# Patient Record
Sex: Female | Born: 1975 | Race: White | Hispanic: No | Smoking: Never smoker
Health system: Southern US, Community
[De-identification: ages and names within clinical notes are randomized; demographics above are authoritative.]

## PROBLEM LIST (undated history)

## (undated) DIAGNOSIS — F32A Depression, unspecified: Secondary | ICD-10-CM

## (undated) DIAGNOSIS — F329 Major depressive disorder, single episode, unspecified: Secondary | ICD-10-CM

## (undated) DIAGNOSIS — F419 Anxiety disorder, unspecified: Secondary | ICD-10-CM

## (undated) DIAGNOSIS — E079 Disorder of thyroid, unspecified: Secondary | ICD-10-CM

## (undated) HISTORY — DX: Disorder of thyroid, unspecified: E07.9

## (undated) HISTORY — DX: Major depressive disorder, single episode, unspecified: F32.9

## (undated) HISTORY — PX: TONSILLECTOMY: SUR1361

## (undated) HISTORY — DX: Anxiety disorder, unspecified: F41.9

## (undated) HISTORY — DX: Depression, unspecified: F32.A

---

## 2000-01-15 ENCOUNTER — Encounter: Payer: Self-pay | Admitting: Obstetrics & Gynecology

## 2000-01-15 ENCOUNTER — Encounter: Admission: RE | Admit: 2000-01-15 | Discharge: 2000-01-15 | Payer: Self-pay | Admitting: Obstetrics & Gynecology

## 2000-09-18 ENCOUNTER — Emergency Department (HOSPITAL_COMMUNITY): Admission: EM | Admit: 2000-09-18 | Discharge: 2000-09-18 | Payer: Self-pay | Admitting: Emergency Medicine

## 2002-06-09 ENCOUNTER — Other Ambulatory Visit: Admission: RE | Admit: 2002-06-09 | Discharge: 2002-06-09 | Payer: Self-pay | Admitting: Obstetrics and Gynecology

## 2002-11-12 ENCOUNTER — Encounter: Admission: RE | Admit: 2002-11-12 | Discharge: 2002-11-12 | Payer: Self-pay | Admitting: Family Medicine

## 2002-11-12 ENCOUNTER — Encounter: Payer: Self-pay | Admitting: Family Medicine

## 2005-09-23 ENCOUNTER — Other Ambulatory Visit: Admission: RE | Admit: 2005-09-23 | Discharge: 2005-09-23 | Payer: Self-pay | Admitting: Obstetrics and Gynecology

## 2005-09-27 ENCOUNTER — Encounter: Admission: RE | Admit: 2005-09-27 | Discharge: 2005-09-27 | Payer: Self-pay | Admitting: Obstetrics and Gynecology

## 2006-02-05 ENCOUNTER — Encounter: Admission: RE | Admit: 2006-02-05 | Discharge: 2006-05-06 | Payer: Self-pay | Admitting: Nurse Practitioner

## 2006-03-31 ENCOUNTER — Encounter: Admission: RE | Admit: 2006-03-31 | Discharge: 2006-03-31 | Payer: Self-pay | Admitting: Obstetrics and Gynecology

## 2006-09-16 ENCOUNTER — Encounter: Admission: RE | Admit: 2006-09-16 | Discharge: 2006-09-16 | Payer: Self-pay | Admitting: Obstetrics and Gynecology

## 2007-01-14 ENCOUNTER — Encounter: Admission: RE | Admit: 2007-01-14 | Discharge: 2007-01-14 | Payer: Self-pay | Admitting: Obstetrics and Gynecology

## 2009-05-01 HISTORY — PX: BACK SURGERY: SHX140

## 2011-06-13 ENCOUNTER — Ambulatory Visit (INDEPENDENT_AMBULATORY_CARE_PROVIDER_SITE_OTHER): Payer: BC Managed Care – PPO | Admitting: Internal Medicine

## 2011-06-13 VITALS — BP 103/72 | HR 85 | Temp 98.2°F | Resp 16 | Ht 66.0 in | Wt 144.0 lb

## 2011-06-13 DIAGNOSIS — E079 Disorder of thyroid, unspecified: Secondary | ICD-10-CM | POA: Insufficient documentation

## 2011-06-13 DIAGNOSIS — R5383 Other fatigue: Secondary | ICD-10-CM

## 2011-06-13 DIAGNOSIS — E039 Hypothyroidism, unspecified: Secondary | ICD-10-CM

## 2011-06-13 DIAGNOSIS — F39 Unspecified mood [affective] disorder: Secondary | ICD-10-CM

## 2011-06-13 LAB — COMPREHENSIVE METABOLIC PANEL
ALT: 17 U/L (ref 0–35)
Albumin: 4.6 g/dL (ref 3.5–5.2)
Alkaline Phosphatase: 52 U/L (ref 39–117)
Calcium: 9.2 mg/dL (ref 8.4–10.5)
Chloride: 104 mEq/L (ref 96–112)
Creat: 0.77 mg/dL (ref 0.50–1.10)
Glucose, Bld: 93 mg/dL (ref 70–99)
Potassium: 3.9 mEq/L (ref 3.5–5.3)
Total Bilirubin: 0.4 mg/dL (ref 0.3–1.2)
Total Protein: 7.3 g/dL (ref 6.0–8.3)

## 2011-06-13 LAB — POCT CBC
Granulocyte percent: 38.6 %G (ref 37–80)
HCT, POC: 38.1 % (ref 37.7–47.9)
Hemoglobin: 12.6 g/dL (ref 12.2–16.2)
MCH, POC: 29.1 pg (ref 27–31.2)
MPV: 9.9 fL (ref 0–99.8)
POC MID %: 21.1 %M — AB (ref 0–12)
Platelet Count, POC: 182 10*3/uL (ref 142–424)
RBC: 4.33 M/uL (ref 4.04–5.48)
WBC: 2.7 10*3/uL — AB (ref 4.6–10.2)

## 2011-06-13 NOTE — Progress Notes (Signed)
  Subjective:    Patient ID: Judith Gonzalez, female    DOB: Apr 04, 1975, 36 y.o.   MRN: [id number]  HPI Has fatigue, dry skin, chills and feels low thyroid Has hx of mood disorder is on prozac   Review of Systems     Objective:   Physical Exam Entirely normal including brisk reflexes. CBC and TSH  Results for orders placed in visit on 06/13/11  POCT CBC      Component Value Range   WBC 2.7 (*) 4.6 - 10.2 (K/uL)   Lymph, poc 1.1  0.6 - 3.4    POC LYMPH PERCENT 40.3  10 - 50 (%L)   MID (cbc) 0.6  0 - 0.9    POC MID % 21.1 (*) 0 - 12 (%M)   POC Granulocyte 1.0 (*) 2 - 6.9    Granulocyte percent 38.6  37 - 80 (%G)   RBC 4.33  4.04 - 5.48 (M/uL)   Hemoglobin 12.6  12.2 - 16.2 (g/dL)   HCT, POC 56.2  13.0 - 47.9 (%)   MCV 87.9  80 - 97 (fL)   MCH, POC 29.1  27 - 31.2 (pg)   MCHC 33.1  31.8 - 35.4 (g/dL)   RDW, POC 86.5     Platelet Count, POC 182  142 - 424 (K/uL)   MPV 9.9  0 - 99.8 (fL)        Assessment & Plan:  Fatigue with a past hx of low thyroid  Lymphocytosis consider viral syndrome add cmet, ebv panel, hiv screen

## 2011-06-14 LAB — EPSTEIN-BARR VIRUS VCA ANTIBODY PANEL
EBV EA IgG: 0.34 {ISR}
EBV NA IgG: 0.7 {ISR}

## 2011-06-14 LAB — TSH: TSH: 2.838 u[IU]/mL (ref 0.350–4.500)

## 2011-06-23 ENCOUNTER — Ambulatory Visit (INDEPENDENT_AMBULATORY_CARE_PROVIDER_SITE_OTHER): Payer: BC Managed Care – PPO | Admitting: Emergency Medicine

## 2011-06-23 VITALS — BP 100/64 | HR 93 | Temp 98.7°F | Resp 16 | Ht 65.0 in | Wt 145.0 lb

## 2011-06-23 DIAGNOSIS — D72829 Elevated white blood cell count, unspecified: Secondary | ICD-10-CM

## 2011-06-23 DIAGNOSIS — Z20828 Contact with and (suspected) exposure to other viral communicable diseases: Secondary | ICD-10-CM

## 2011-06-23 DIAGNOSIS — R5383 Other fatigue: Secondary | ICD-10-CM

## 2011-06-23 DIAGNOSIS — B2790 Infectious mononucleosis, unspecified without complication: Secondary | ICD-10-CM

## 2011-06-23 DIAGNOSIS — D7289 Other specified disorders of white blood cells: Secondary | ICD-10-CM

## 2011-06-23 DIAGNOSIS — R5381 Other malaise: Secondary | ICD-10-CM

## 2011-06-23 LAB — COMPREHENSIVE METABOLIC PANEL
BUN: 9 mg/dL (ref 6–23)
CO2: 30 mEq/L (ref 19–32)
Calcium: 9.6 mg/dL (ref 8.4–10.5)
Creat: 0.64 mg/dL (ref 0.50–1.10)
Glucose, Bld: 106 mg/dL — ABNORMAL HIGH (ref 70–99)
Potassium: 4.2 mEq/L (ref 3.5–5.3)
Total Bilirubin: 0.3 mg/dL (ref 0.3–1.2)

## 2011-06-23 LAB — POCT CBC
Granulocyte percent: 72.5 %G (ref 37–80)
Hemoglobin: 11.1 g/dL — AB (ref 12.2–16.2)
Lymph, poc: 1.7 (ref 0.6–3.4)
MID (cbc): 0.7 (ref 0–0.9)
WBC: 8.6 10*3/uL (ref 4.6–10.2)

## 2011-06-23 NOTE — Progress Notes (Signed)
  Subjective:    Patient ID: Judith Gonzalez, female    DOB: 1975-09-05, 36 y.o.   MRN: [id number]  HPI patient here to followup recent diagnosis of mononucleosis. She has felt somewhat better but still feels incredibly fatigued. Apparently also in her class have had an outbreak of parvovirus and she has questions about whether her illness could be related to parvovirus.    Review of Systems patient primarily has fatigue she denies any chest pain shortness of breath abdominal pain or other symptoms     Objective:   Physical Exam  Constitutional: She appears well-developed and well-nourished.  HENT:  Head: Normocephalic.  Right Ear: External ear normal.  Left Ear: External ear normal.  Eyes: Pupils are equal, round, and reactive to light.  Neck: No JVD present. No tracheal deviation present. No thyromegaly present.  Cardiovascular: Normal rate and regular rhythm.   Pulmonary/Chest: No respiratory distress. She has no wheezes. She has no rales. She exhibits no tenderness.  Abdominal:       On abdominal exam the spleen is not palpable  Lymphadenopathy:    She has no cervical adenopathy.  Skin: She is not diaphoretic.          Assessment & Plan:   she did drop her hemoglobin from her last visit however her white count has returned to normal which is very encouraging. She continues has shoddy adenopathy in her neck. We'll recheck titers in that only one of her titers was positive before and she had normal LFTs. I'm not sure this has actually been mono but may have been some other acute viral illness.

## 2011-06-24 LAB — EPSTEIN-BARR VIRUS VCA ANTIBODY PANEL
EBV VCA IgG: 2.83 {ISR} — ABNORMAL HIGH
EBV VCA IgM: 0.64 {ISR}

## 2011-06-26 LAB — PARVOVIRUS B19 ANTIBODY, IGG AND IGM
Parovirus B19 IgG Abs: 2.6 index — ABNORMAL HIGH (ref <0.9)
Parovirus B19 IgM Abs: 6.2 index — ABNORMAL HIGH (ref <0.9)

## 2011-07-04 ENCOUNTER — Ambulatory Visit (INDEPENDENT_AMBULATORY_CARE_PROVIDER_SITE_OTHER): Payer: BC Managed Care – PPO | Admitting: Family Medicine

## 2011-07-04 ENCOUNTER — Encounter: Payer: Self-pay | Admitting: Family Medicine

## 2011-07-04 VITALS — BP 108/63 | HR 84 | Temp 97.8°F | Resp 16 | Ht 64.5 in | Wt 141.8 lb

## 2011-07-04 DIAGNOSIS — B343 Parvovirus infection, unspecified: Secondary | ICD-10-CM

## 2011-07-04 DIAGNOSIS — Z23 Encounter for immunization: Secondary | ICD-10-CM

## 2011-07-04 LAB — CBC WITH DIFFERENTIAL/PLATELET
Basophils Absolute: 0.1 10*3/uL (ref 0.0–0.1)
Basophils Relative: 1 % (ref 0–1)
Hemoglobin: 12.1 g/dL (ref 12.0–15.0)
Lymphocytes Relative: 37 % (ref 12–46)
Lymphs Abs: 2.5 10*3/uL (ref 0.7–4.0)
MCH: 29.1 pg (ref 26.0–34.0)
MCV: 90.6 fL (ref 78.0–100.0)
Monocytes Absolute: 0.9 10*3/uL (ref 0.1–1.0)
Monocytes Relative: 13 % — ABNORMAL HIGH (ref 3–12)
Neutro Abs: 2.9 10*3/uL (ref 1.7–7.7)
Platelets: 393 10*3/uL (ref 150–400)
RBC: 4.16 MIL/uL (ref 3.87–5.11)

## 2011-07-04 NOTE — Patient Instructions (Addendum)
Parvovirus B19 Antibody This is a blood test which includes several different classes of viruses. The B19 virus causes disease in humans. It is a common cause of erythema infectiosum which occurs mostly in children and is otherwise known as fifth disease.  PREPARATION FOR TEST No preparation or fasting is necessary. NORMAL FINDINGS Negative for IgM- and IgG-specific antibodies to parvovirus B19. Ranges for normal findings may vary among different laboratories and hospitals. You should always check with your doctor after having lab work or other tests done to discuss the meaning of your test results and whether your values are considered within normal limits. MEANING OF TEST  Your caregiver will go over the test results with you and discuss the importance and meaning of your results, as well as treatment options and the need for additional tests if necessary. OBTAINING THE TEST RESULTS  It is your responsibility to obtain your test results. Ask the lab or department performing the test when and how you will get your results. Document Released: 04/06/2004 Document Revised: 02/21/2011 Document Reviewed: 02/14/2008 [facility] Patient Information 2012 [place], [place].  Fifth Disease Erythema Infectiosum is called fifth disease. It is a mild illness caused by a virus. This virus most commonly occurs in children. The disease usually causes a bright red rash that appears on both cheeks. The rash has a "slapped cheek" appearance. Before the rash, the patient usually has a low-grade fever, mild upper respiratory symptoms, and a headache. One to three days after the cheek rash appears, a pink, lacy rash appears on the body, arms, and legs. This rash may come and go for up to 5 weeks. It often gets brighter following warm baths, exercise, and sun exposure. Your child may have no other symptoms or only a slight runny nose, sore throat, and very low fever. Complications are rare. This illness is quite harmless. Fifth  disease also occurs in adolescents and adults. In this age group initial symptoms will be joint pain. The joint pain is usually in the hands, wrists, and ankles. HOME CARE INSTRUCTIONS   Treatment is not necessary. No vaccine is available.   This disease is not very contagious. It is usually not necessary to keep your child away from other children.   Pregnant women should avoid being exposed.   Only take over-the-counter or prescription medicines for pain, discomfort, or fever as directed by your caregiver.  SEEK IMMEDIATE MEDICAL CARE IF:   An oral temperature above 102 F (38.9 C) develops, or the temperature remains high and is not controlled by medication.   Your child seems to be getting worse.   The rash becomes itchy.  MAKE SURE YOU:   Understand these instructions.   Will watch your condition.   Will get help right away if you are not doing well or get worse.  Document Released: 03/01/2000 Document Revised: 02/21/2011 Document Reviewed: 07/01/2010 [facility] Patient Information 2012 [place], [place].

## 2011-07-04 NOTE — Progress Notes (Signed)
  Subjective:    Patient ID: Judith Gonzalez, female    DOB: 05-11-1975, 36 y.o.   MRN: [id number]  HPI  This 36 y.o. 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 She is here for repeat labs (ESR and CBC) as she  had abnormal results during her acute illness. She had mild arthralgias last week which were relieved  with OTC NSAIDs. Denies fever, headache, rash or fatigue.     Pt requests Tetanus; she was bitten on the finger by a small squirrel a few days ago. She [facility]  [facility] to find out what to do and was told that squirrels rarely carry Rabies; this animal did not  appear rabid  to the pt. The wound did not appear to be infected per pt.    Review of Systems As per HPI     Objective:   Physical Exam  Nursing note and vitals reviewed. Constitutional: She is oriented to person, place, and time. She appears well-developed and well-nourished. No distress.  HENT:  Head: Normocephalic and atraumatic.  Right Ear: External ear normal.  Left Ear: External ear normal.  Mouth/Throat: Oropharynx is clear and moist. No oropharyngeal exudate.  Eyes: EOM are normal. Right eye exhibits no discharge. Left eye exhibits no discharge. No scleral icterus.       Conj injected  Neck: Normal range of motion. Neck supple. No thyromegaly present.  Cardiovascular: Normal rate, regular rhythm and normal heart sounds.   No murmur heard. Pulmonary/Chest: Effort normal and breath sounds normal. No respiratory distress.  Abdominal: She exhibits no mass. There is no tenderness. There is no guarding.  Musculoskeletal: Normal range of motion. She exhibits no edema.  Lymphadenopathy:    She has no cervical adenopathy.  Neurological: She is alert and oriented to person, place, and time. No cranial nerve deficit.  Skin: Skin is warm and dry. No erythema.  Psychiatric: She has a normal mood and affect. Her behavior is normal.  Thought content normal.          Assessment & Plan:   1. Parvovirus infection  CBC with Differential, Sedimentation Rate  2. Need for prophylactic vaccination with combined diphtheria-tetanus-pertussis (DTP) vaccine  Tdap vaccine greater than or equal to 7yo IM

## 2011-07-05 LAB — SEDIMENTATION RATE: Sed Rate: 11 mm/hr (ref 0–22)

## 2011-07-05 NOTE — Progress Notes (Signed)
Quick Note:  Please notify pt that results are normal.   Provide pt with copy of labs. ______ 

## 2011-10-18 ENCOUNTER — Other Ambulatory Visit: Payer: Self-pay | Admitting: *Deleted

## 2011-10-18 MED ORDER — FLUOXETINE HCL 20 MG PO CAPS: 20.0000 mg | ORAL_CAPSULE | Freq: Every day | ORAL | Status: DC

## 2011-11-18 ENCOUNTER — Other Ambulatory Visit: Payer: Self-pay

## 2011-11-18 MED ORDER — FLUOXETINE HCL 20 MG PO CAPS: 20.0000 mg | ORAL_CAPSULE | Freq: Every day | ORAL | Status: DC

## 2011-12-13 ENCOUNTER — Ambulatory Visit (INDEPENDENT_AMBULATORY_CARE_PROVIDER_SITE_OTHER): Payer: BC Managed Care – PPO | Admitting: Family Medicine

## 2011-12-13 ENCOUNTER — Encounter: Payer: Self-pay | Admitting: Family Medicine

## 2011-12-13 VITALS — BP 102/68 | HR 62 | Temp 97.7°F | Resp 16 | Ht 64.5 in | Wt 141.6 lb

## 2011-12-13 DIAGNOSIS — F3289 Other specified depressive episodes: Secondary | ICD-10-CM

## 2011-12-13 DIAGNOSIS — F32A Depression, unspecified: Secondary | ICD-10-CM

## 2011-12-13 DIAGNOSIS — F329 Major depressive disorder, single episode, unspecified: Secondary | ICD-10-CM

## 2011-12-13 MED ORDER — FLUOXETINE HCL 40 MG PO CAPS: 40.0000 mg | ORAL_CAPSULE | Freq: Every day | ORAL | Status: DC

## 2011-12-13 NOTE — Patient Instructions (Signed)
Fluoxetine dose has been increased to 40 mg every AM; if you start to feel more nervous, insomnia, decreased appetite or nausea, decreased sex drive, then please call the office. I may have to reduce your dose to 30 mg; the goal is to improve your mood and mental functioning without causing side effects.

## 2011-12-17 ENCOUNTER — Encounter: Payer: Self-pay | Admitting: Family Medicine

## 2011-12-17 NOTE — Progress Notes (Signed)
S: This 36 y.o. Cauc female  Is here for medication refill (Fluoxetine). She reports increased symptoms c/w worsening depression. These are mild changes  (sleep disturbance, concentration difficulties and feeling "down") but she wants to discuss increasing current medication versus trying a different medication. She would eventually like to discontinue this medication.  ROS: Negative for appetite change or unexpected weight change, diaphoresis, palpitations or CP, SOB, nausea or vomiting, change in stools or abd pain, HA or dizziness, numbness or weakness, agitation, dysphoric mood or crying spells, significant loss of sleep, behavior problems or SI/HI.   O:  Filed Vitals:   12/13/11 1446  BP: 102/68                                                Weight is down 2 lbs in 6 months  Pulse: 62  Temp: 97.7 F (36.5 C)  Resp: 16   GEN: In NAD; WN,WD. HENT: [place]/AT; EOMI with clear conj/scl. COR: RRR LUNGS: Normal resp rate and effort. NEURO: A&O x 3; CNs intact, otherwise unremarkable.  A/P: 1. Depression   Increase Fluoxetine to 40 mg every AM; advised of adverse effects. May have to adjust dose downward to 30 mg daily if side effects occur.

## 2012-03-13 ENCOUNTER — Encounter: Payer: Self-pay | Admitting: Family Medicine

## 2012-03-13 ENCOUNTER — Ambulatory Visit (INDEPENDENT_AMBULATORY_CARE_PROVIDER_SITE_OTHER): Payer: BC Managed Care – PPO | Admitting: Family Medicine

## 2012-03-13 VITALS — BP 112/74 | HR 70 | Temp 99.0°F | Resp 16 | Ht 66.0 in | Wt 144.0 lb

## 2012-03-13 DIAGNOSIS — F329 Major depressive disorder, single episode, unspecified: Secondary | ICD-10-CM

## 2012-03-13 DIAGNOSIS — Z760 Encounter for issue of repeat prescription: Secondary | ICD-10-CM

## 2012-03-13 DIAGNOSIS — F32A Depression, unspecified: Secondary | ICD-10-CM

## 2012-03-13 DIAGNOSIS — R1013 Epigastric pain: Secondary | ICD-10-CM

## 2012-03-13 DIAGNOSIS — K3189 Other diseases of stomach and duodenum: Secondary | ICD-10-CM

## 2012-03-13 DIAGNOSIS — F3289 Other specified depressive episodes: Secondary | ICD-10-CM

## 2012-03-13 MED ORDER — FLUOXETINE HCL 40 MG PO CAPS: 40.0000 mg | ORAL_CAPSULE | Freq: Every day | ORAL | Status: DC

## 2012-03-13 NOTE — Patient Instructions (Addendum)
Indigestion  Indigestion is discomfort in the upper abdomen that is caused by underlying problems such as gastroesophageal reflux disease (GERD), ulcers, or gallbladder problems.   CAUSES   Indigestion can be caused by many things. Possible causes include:   Stomach acid in the esophagus.   Stomach infections, usually caused by the bacteria H. pylori.   Being overweight.   Hiatal hernia. This means part of the stomach pushes up through the diaphragm.   Overeating.   Emotional problems, such as stress, anxiety, or depression.   Poor nutrition.   Consuming too much alcohol, tobacco, or caffeine.   Consuming spicy foods, fats, peppermint, chocolate, tomato products, citrus, or fruit juices.   Medicines such as aspirin and other anti-inflammatory drugs, hormones, steroids, and thyroid medicines.   Gastroparesis. This is a condition in which the stomach does not empty properly.   Stomach cancer.   Pregnancy, due to an increase in hormone levels, a relaxation of muscles in the digestive tract, and pressure on the stomach from the growing fetus.  SYMPTOMS    Uncomfortable feeling of fullness after eating.   Pain or burning sensation in the upper abdomen.   Bloating.   Belching and gas.   Nausea and vomiting.   Acidic taste in the mouth.   Burning sensation in the chest (heartburn).  DIAGNOSIS   Your caregiver will review your medical history and perform a physical exam. Other tests, such as blood tests, stool tests, X-rays, and other imaging scans, may be done to check for more serious problems.  TREATMENT   Liquid antacids and other drugs may be given to block stomach acid secretion. Medicines that increase esophageal muscle tone may also be given to help reduce symptoms. If an infection is found, antibiotic medicine may be given.  HOME CARE INSTRUCTIONS   Avoid foods and drinks that make your symptoms worse, such as:   Caffeine or alcoholic drinks.   Chocolate.   Peppermint or mint  flavorings.   Garlic and onions.   Spicy foods.   Citrus fruits, such as oranges, lemons, or limes.   Tomato-based foods such as sauce, chili, salsa, and pizza.   Fried and fatty foods.   Avoid eating for the 3 hours prior to your bedtime.   Eat small, frequent meals instead of large meals.   Stop smoking if you smoke.   Maintain a healthy weight.   Wear loose-fitting clothing. Do not wear anything tight around your waist that causes pressure on your stomach.   Raise the head of your bed 4 to 8 inches with wood blocks to help you sleep. Extra pillows will not help.   Only take over-the-counter or prescription medicines as directed by your caregiver.   Do not take aspirin, ibuprofen, or other nonsteroidal anti-inflammatory drugs (NSAIDs).  SEEK IMMEDIATE MEDICAL CARE IF:    You are not better after 2 days.   You have chest pressure or pain that radiates up into your neck, arms, back, jaw, or upper abdomen.   You have difficulty swallowing.   You keep vomiting.   You have black or bloody stools.   You have a fever.   You have dizziness, fainting, difficulty breathing, or heavy sweating.   You have severe abdominal pain.   You lose weight without trying.  MAKE SURE YOU:   Understand these instructions.   Will watch your condition.   Will get help right away if you are not doing well or get worse.  Document Released: 04/11/2004 

## 2012-03-18 DIAGNOSIS — F32A Depression, unspecified: Secondary | ICD-10-CM | POA: Insufficient documentation

## 2012-03-18 DIAGNOSIS — F329 Major depressive disorder, single episode, unspecified: Secondary | ICD-10-CM | POA: Insufficient documentation

## 2012-03-18 NOTE — Progress Notes (Signed)
S: This 37 y.o  Cauc female returns for follow-up re: increased dose of anti-depressant medication. She feels better, is able to focus at work and at home, has less fatigue and is sleeping better. She has no adverse effects with increased Fluoxetine dose of 40 mg.   Pt reports chronic dyspepsia for which she has been taking OTC Zantac; this is usually effective but she is not taking it daily. She reports no particular food intolerances, denies early satiety, n/v/d or constipation, BRBPR or melena. She has had no unexplained weight loss.  ROS: As per HPI.  O: Filed Vitals:   03/13/12 1311  BP: 112/74  Pulse: 70  Temp: 99 F (37.2 C)  Resp: 16   GEN: In NAD; WN,WD. HENT: [place]/AT; EOMMI w/ clear conj, nonicteric sclerae. Post ph is moist and clear w/o lesions. NECK: Supple w/o LAN orTMG. BACK: No CVAT. COR: RRR. LUNGS: Normal resp rate and effort. ABD: Nl appearance/ flat w/o distention. Minimal epig tenderness. Murphy's sign negative. No masses or HSM. NEURO: A&O x 3; CNs intact. Nonfocal.  A/P:  1. Depression - stable on current dose of Fluoxetine 40 mg daily.  2. Dyspepsia  Continue OTC Zantac daily; if symptoms do not resolve w/ dietary modifications, RTC for re-evaluation. Pt noted to have elevated LFTs in April at time of Parvovirus infection. She declines repeat testing at this time.  3. Issue of repeat prescriptions

## 2012-12-08 ENCOUNTER — Other Ambulatory Visit: Payer: Self-pay | Admitting: Family Medicine

## 2012-12-17 ENCOUNTER — Encounter: Payer: Self-pay | Admitting: Family Medicine

## 2012-12-17 ENCOUNTER — Ambulatory Visit (INDEPENDENT_AMBULATORY_CARE_PROVIDER_SITE_OTHER): Payer: BC Managed Care – PPO | Admitting: Family Medicine

## 2012-12-17 VITALS — BP 109/70 | HR 79 | Temp 98.1°F | Resp 16 | Ht 65.5 in | Wt 138.0 lb

## 2012-12-17 DIAGNOSIS — Z23 Encounter for immunization: Secondary | ICD-10-CM

## 2012-12-17 DIAGNOSIS — F3289 Other specified depressive episodes: Secondary | ICD-10-CM

## 2012-12-17 DIAGNOSIS — F32A Depression, unspecified: Secondary | ICD-10-CM

## 2012-12-17 DIAGNOSIS — F329 Major depressive disorder, single episode, unspecified: Secondary | ICD-10-CM

## 2012-12-17 MED ORDER — FLUOXETINE HCL 40 MG PO CAPS: 40.0000 mg | ORAL_CAPSULE | Freq: Every day | ORAL | Status: DC

## 2012-12-17 NOTE — Progress Notes (Signed)
S: This 37 y.o. Cauc female has chronic depression, well controlled on Fluoxetine. No adverse effects reported. Pt states she is doing well and has no new concerns. Pt eats and sleeps well and has no thoughts of self harm. She has no moods swings and no abnormal behavior or thoughts.  Pt recently started taking iron supplement because she was having unexplained bruising. She does note that she plays soccer w/ her son so that might explain the bruises. They have disappeared since starting iron. Review of CBC from last year does not reveal anemia or other abnormalities.   Patient Active Problem List   Diagnosis Date Noted  . Depression 03/18/2012  . Thyroid dysfunction 06/13/2011   PMHx, Soc Hx and Fam Hx reviewed.   ROS: As per HPI.  O: Filed Vitals:   12/17/12 1613  BP: 109/70  Pulse: 79  Temp: 98.1 F (36.7 C)  Resp: 16   GEN: In NAD; WN,WD. HENT: [place]/AT; EOMI w/ clear conj/sclerae. Otherwise unremarkable. COR: RRR. LUNGS: Normal resp rate and effort. SKIN: W&D; intact w/o erythema, rashes or pallor. NEURO: A&O x 3; Cns intact. Nonfocal.  A/P: Depression-  Stable on Fluoxetine; continue current dose 40 mg daily.  Need for prophylactic vaccination and inoculation against influenza - Plan: Flu Vaccine QUAD 36+ mos IM

## 2013-03-23 ENCOUNTER — Other Ambulatory Visit: Payer: Self-pay | Admitting: Family Medicine

## 2013-03-23 ENCOUNTER — Ambulatory Visit: Payer: Self-pay

## 2013-03-23 ENCOUNTER — Other Ambulatory Visit: Payer: Self-pay | Admitting: *Deleted

## 2013-03-23 DIAGNOSIS — R52 Pain, unspecified: Secondary | ICD-10-CM

## 2013-05-07 ENCOUNTER — Encounter: Payer: Self-pay | Admitting: Family Medicine

## 2013-05-07 ENCOUNTER — Ambulatory Visit (INDEPENDENT_AMBULATORY_CARE_PROVIDER_SITE_OTHER): Payer: BC Managed Care – PPO | Admitting: Family Medicine

## 2013-05-07 VITALS — BP 100/62 | HR 71 | Temp 97.7°F | Resp 16 | Ht 65.0 in | Wt 143.2 lb

## 2013-05-07 DIAGNOSIS — F3289 Other specified depressive episodes: Secondary | ICD-10-CM

## 2013-05-07 DIAGNOSIS — F32A Depression, unspecified: Secondary | ICD-10-CM

## 2013-05-07 DIAGNOSIS — E079 Disorder of thyroid, unspecified: Secondary | ICD-10-CM

## 2013-05-07 DIAGNOSIS — Z808 Family history of malignant neoplasm of other organs or systems: Secondary | ICD-10-CM

## 2013-05-07 DIAGNOSIS — Z13220 Encounter for screening for lipoid disorders: Secondary | ICD-10-CM

## 2013-05-07 DIAGNOSIS — F329 Major depressive disorder, single episode, unspecified: Secondary | ICD-10-CM

## 2013-05-07 LAB — LIPID PANEL
Cholesterol: 152 mg/dL (ref 0–200)
HDL: 49 mg/dL (ref >39)
LDL Cholesterol: 87 mg/dL (ref 0–99)
Total CHOL/HDL Ratio: 3.1 ratio
Triglycerides: 78 mg/dL (ref <150)
VLDL: 16 mg/dL (ref 0–40)

## 2013-05-07 LAB — T3, FREE: T3 FREE: 3 pg/mL (ref 2.3–4.2)

## 2013-05-07 LAB — COMPLETE METABOLIC PANEL WITHOUT GFR
ALT: 15 U/L (ref 0–35)
AST: 18 U/L (ref 0–37)
Albumin: 4.5 g/dL (ref 3.5–5.2)
Alkaline Phosphatase: 44 U/L (ref 39–117)
BUN: 12 mg/dL (ref 6–23)
CO2: 29 meq/L (ref 19–32)
Calcium: 9.3 mg/dL (ref 8.4–10.5)
Chloride: 103 meq/L (ref 96–112)
Creat: 0.67 mg/dL (ref 0.50–1.10)
GFR, Est African American: >89 mL/min
GFR, Est Non African American: >89 mL/min
Glucose, Bld: 91 mg/dL (ref 70–99)
Potassium: 4.2 meq/L (ref 3.5–5.3)
Sodium: 138 meq/L (ref 135–145)
Total Bilirubin: 0.4 mg/dL (ref 0.2–1.2)
Total Protein: 7 g/dL (ref 6.0–8.3)

## 2013-05-07 LAB — CBC WITH DIFFERENTIAL/PLATELET
BASOS ABS: 0.1 10*3/uL (ref 0.0–0.1)
Basophils Relative: 1 % (ref 0–1)
EOS ABS: 0.1 10*3/uL (ref 0.0–0.7)
EOS PCT: 2 % (ref 0–5)
HEMATOCRIT: 37.7 % (ref 36.0–46.0)
HEMOGLOBIN: 12.9 g/dL (ref 12.0–15.0)
LYMPHS ABS: 1.7 10*3/uL (ref 0.7–4.0)
LYMPHS PCT: 30 % (ref 12–46)
MCH: 30.2 pg (ref 26.0–34.0)
MCHC: 34.2 g/dL (ref 30.0–36.0)
MCV: 88.3 fL (ref 78.0–100.0)
MONO ABS: 0.6 10*3/uL (ref 0.1–1.0)
MONOS PCT: 11 % (ref 3–12)
NEUTROS ABS: 3.1 10*3/uL (ref 1.7–7.7)
Neutrophils Relative %: 56 % (ref 43–77)
PLATELETS: 269 10*3/uL (ref 150–400)
RBC: 4.27 MIL/uL (ref 3.87–5.11)
RDW: 13.1 % (ref 11.5–15.5)
WBC: 5.6 10*3/uL (ref 4.0–10.5)

## 2013-05-07 LAB — TSH: TSH: 2.72 u[IU]/mL (ref 0.350–4.500)

## 2013-05-07 LAB — T4, FREE: Free T4: 0.65 ng/dL — ABNORMAL LOW (ref 0.80–1.80)

## 2013-05-07 MED ORDER — FLUOXETINE HCL 40 MG PO CAPS: 40.0000 mg | ORAL_CAPSULE | Freq: Every day | ORAL | Status: DC

## 2013-05-07 NOTE — Patient Instructions (Signed)
Depression, Adult Depression refers to feeling sad, low, down in the dumps, blue, gloomy, or empty. In general, there are two kinds of depression: 1. Depression that we all experience from time to time because of upsetting life experiences, including the loss of a job or the ending of a relationship (normal sadness or normal grief). This kind of depression is considered normal, is short lived, and resolves within a few days to 2 weeks. (Depression experienced after the loss of a loved one is called bereavement. Bereavement often lasts longer than 2 weeks but normally gets better with time.) 2. Clinical depression, which lasts longer than normal sadness or normal grief or interferes with your ability to function at home, at work, and in school. It also interferes with your personal relationships. It affects almost every aspect of your life. Clinical depression is an illness. Symptoms of depression also can be caused by conditions other than normal sadness and grief or clinical depression. Examples of these conditions are listed as follows:  Physical illness Some physical illnesses, including underactive thyroid gland (hypothyroidism), severe anemia, specific types of cancer, diabetes, uncontrolled seizures, heart and lung problems, strokes, and chronic pain are commonly associated with symptoms of depression.  Side effects of some prescription medicine In some people, certain types of prescription medicine can cause symptoms of depression.  Substance abuse Abuse of alcohol and illicit drugs can cause symptoms of depression. SYMPTOMS Symptoms of normal sadness and normal grief include the following:  Feeling sad or crying for short periods of time.  Not caring about anything (apathy).  Difficulty sleeping or sleeping too much.  No longer able to enjoy the things you used to enjoy.  Desire to be by oneself all the time (social isolation).  Lack of energy or motivation.  Difficulty  concentrating or remembering.  Change in appetite or weight.  Restlessness or agitation. Symptoms of clinical depression include the same symptoms of normal sadness or normal grief and also the following symptoms:  Feeling sad or crying all the time.  Feelings of guilt or worthlessness.  Feelings of hopelessness or helplessness.  Thoughts of suicide or the desire to harm yourself (suicidal ideation).  Loss of touch with reality (psychotic symptoms). Seeing or hearing things that are not real (hallucinations) or having false beliefs about your life or the people around you (delusions and paranoia). DIAGNOSIS  The diagnosis of clinical depression usually is based on the severity and duration of the symptoms. Your caregiver also will ask you questions about your medical history and substance use to find out if physical illness, use of prescription medicine, or substance abuse is causing your depression. Your caregiver also may order blood tests. TREATMENT  Typically, normal sadness and normal grief do not require treatment. However, sometimes antidepressant medicine is prescribed for bereavement to ease the depressive symptoms until they resolve. The treatment for clinical depression depends on the severity of your symptoms but typically includes antidepressant medicine, counseling with a mental health professional, or a combination of both. Your caregiver will help to determine what treatment is best for you. Depression caused by physical illness usually goes away with appropriate medical treatment of the illness. If prescription medicine is causing depression, talk with your caregiver about stopping the medicine, decreasing the dose, or substituting another medicine. Depression caused by abuse of alcohol or illicit drugs abuse goes away with abstinence from these substances. Some adults need professional help in order to stop drinking or using drugs. SEEK IMMEDIATE CARE IF:  You have   thoughts  about hurting yourself or others.  You lose touch with reality (have psychotic symptoms).  You are taking medicine for depression and have a serious side effect. FOR MORE INFORMATION National Alliance on Mental Illness: www.nami.[employer]: https://carter.com/ Document Released: 03/01/2000 Document Revised: 09/03/2011 Document Reviewed: 06/03/2011 [facility] Patient Information 2014 [place].    Continue your current medications. Also, you may want to start a [place] which is full of omega-3 oils ; in general, this helps give you a sense of well being and it has anti-inflammatory properties.  Emmit Alexanders with everything and I will see you in 2 months. You can contact me through MyChart once you get it set up.

## 2013-05-08 LAB — VITAMIN D 25 HYDROXY (VIT D DEFICIENCY, FRACTURES): Vit D, 25-Hydroxy: 45 ng/mL (ref 30–89)

## 2013-05-12 ENCOUNTER — Encounter: Payer: Self-pay | Admitting: Family Medicine

## 2013-05-12 DIAGNOSIS — Z808 Family history of malignant neoplasm of other organs or systems: Secondary | ICD-10-CM | POA: Insufficient documentation

## 2013-05-12 NOTE — Progress Notes (Signed)
S:  This 38 y.o. Cauc female has hx of chronic depression with periods where Fluoxetine seems to be ineffective. Current stressors in her family life contribute to that feeling for several weeks. She and her husband are separated; she has a son w/ learning disabilities. 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 She has found that there is less stress in the home w/ this change. Schedule now allows for better self-care with improved nutrition and regular exercise. Pt requests thyroid function test due to hx of mild dysfunction; fatigue has lessened w/ lifestyle changes. She denies skin changes, constipation or other GI changes. She does endorse mild sleep disturbance associated w/ depression and recent stressors.  Pt requests DERM referral due to family hx of melanoma; her brother has had several lesions removed  (he is in his 71s).  Patient Active Problem List   Diagnosis Date Noted  . Depression 03/18/2012  . Thyroid dysfunction 06/13/2011   Prior to Admission medications   Medication Sig Start Date End Date Taking? Authorizing Provider  cetirizine (ZYRTEC) 10 MG tablet Take 10 mg by mouth daily.   Yes Historical Provider, MD  cholecalciferol (VITAMIN D) 1000 UNITS tablet Take 1,000 Units by mouth daily.   Yes Historical Provider, MD  ferrous fumarate (HEMOCYTE - 106 MG FE) 325 (106 FE) MG TABS tablet Take 1 tablet by mouth.   Yes Historical Provider, MD  FLUoxetine (PROZAC) 40 MG capsule Take 1 capsule (40 mg total) by mouth daily. 05/07/13  Yes Barton Fanny, MD  Multiple Vitamin (MULTIVITAMIN) tablet Take 1 tablet by mouth daily.   Yes Historical Provider, MD   PMHx, Surg Hx, Soc and Fam Hx reviewed.    ROS: As per HPI. Negative for anorexia, abnormal weight change, vision disturbances, CP or tightness, palpitations, SOB or DOE, cough, GI problems, skin or hair changes, HA, dizziness, asymmetric numbness or weakness or syncope.  O: Filed  Vitals:   05/07/13 0928  BP: 100/62  Pulse: 71  Temp: 97.7 F (36.5 C)  Resp: 16   GEN: in NAD: WN,WD. HENT: [place]/AT. EOMI w/ clear conj/sclerae; otherwise unremarkable. COR: RRR. No edema. LUNGS: Normal resp rate and effort. SKIN: W&D; intact w/o diaphoresis, erythema or pallor. MS: MAEs; no joint deformities or muscle atrophy. NEURO: A&O x 3; CNs intact. Nonfocal.  A/P: Depression - Pt agrees to continue current dose of Fluoxetine along w/ lifestyle changes.  She thinks that she will feel better once her personal issues have been sorted out and the winter weather improves as spring approaches. Plan: CBC with Differential, Vitamin D, 25-hydroxy  Thyroid dysfunction - Plan: TSH, T3, Free, T4, Free, COMPLETE METABOLIC PANEL WITH GFR  Family history of melanoma - Plan: Ambulatory referral to Dermatology  Screening for hyperlipidemia - Plan: Lipid panel  Meds ordered this encounter  Medications  . FLUoxetine (PROZAC) 40 MG capsule    Sig: Take 1 capsule (40 mg total) by mouth daily.    Dispense:  90 capsule    Refill:  1

## 2013-05-14 ENCOUNTER — Other Ambulatory Visit: Payer: Self-pay | Admitting: Family Medicine

## 2013-05-14 MED ORDER — FLUOXETINE HCL 20 MG PO CAPS: ORAL_CAPSULE | ORAL | Status: DC

## 2013-06-15 ENCOUNTER — Other Ambulatory Visit: Payer: Self-pay | Admitting: Obstetrics

## 2013-06-15 DIAGNOSIS — N63 Unspecified lump in unspecified breast: Secondary | ICD-10-CM

## 2013-06-16 ENCOUNTER — Ambulatory Visit
Admission: RE | Admit: 2013-06-16 | Discharge: 2013-06-16 | Disposition: A | Discharge status: Home / Self Care | Payer: BC Managed Care – PPO | Source: Ambulatory Visit | Attending: Obstetrics | Admitting: Obstetrics

## 2013-06-16 DIAGNOSIS — N63 Unspecified lump in unspecified breast: Secondary | ICD-10-CM

## 2013-06-18 ENCOUNTER — Encounter: Payer: Self-pay | Admitting: Family Medicine

## 2013-07-09 ENCOUNTER — Ambulatory Visit (INDEPENDENT_AMBULATORY_CARE_PROVIDER_SITE_OTHER): Payer: BC Managed Care – PPO | Admitting: Family Medicine

## 2013-07-09 ENCOUNTER — Encounter: Payer: Self-pay | Admitting: Family Medicine

## 2013-07-09 VITALS — BP 86/56 | HR 72 | Temp 98.7°F | Resp 16 | Ht 65.0 in | Wt 143.4 lb

## 2013-07-09 DIAGNOSIS — F32A Depression, unspecified: Secondary | ICD-10-CM

## 2013-07-09 DIAGNOSIS — F329 Major depressive disorder, single episode, unspecified: Secondary | ICD-10-CM

## 2013-07-09 DIAGNOSIS — F3289 Other specified depressive episodes: Secondary | ICD-10-CM

## 2013-07-09 MED ORDER — BUPROPION HCL ER (XL) 150 MG PO TB24: 150.0000 mg | ORAL_TABLET | Freq: Every day | ORAL | Status: DC

## 2013-07-09 NOTE — Patient Instructions (Addendum)
Wellbutrin XL 150 mg has been prescribed for depression (generic is Bupropion). Some of the side effects include agitation and insomnia; this is why it is important to take it early in the day. You may have some weight change, headache, GI side effects (minor), palpitations and elevated blood pressure.  Contact me through MyChart about how this medication is working for you.

## 2013-07-11 ENCOUNTER — Encounter: Payer: Self-pay | Admitting: Family Medicine

## 2013-07-11 NOTE — Progress Notes (Signed)
S:  This 38 y.o. Cauc female has chronic depression, previously treated w/ Fluoxetine. Dose had been as high as 60 mg but she was having trouble w/ focus and sleep disturbance. Her counselor recommended Wellbutrin as several clients had good results w/ increased focus w/o significant side effects. Pt reduced Fluoxetine to 40 mg then 20 mg daily as instructed. She has 1 capsule left. Personal stressors are being managed; she is separated from her husband and her 15 y.o.son is a "special needs" child. She is not employed at this time by choice.  Patient Active Problem List   Diagnosis Date Noted  . Family history of melanoma 05/12/2013  . Depression 03/18/2012  . Thyroid dysfunction 06/13/2011   PMHx, Surg Hx, Soc and Fam Hx reviewed.  MEDICATIoNS reconciled.  ROS: As per HPI. Negative for anorexia, diaphoresis, CP or tightness, palpitations, HA, dizziness, weakness, syncope, agitation, confusion or behavior disturbances.. No thoughts of self harm, SI/HI.  O: Filed Vitals:   07/09/13 1105  BP: 86/56  Pulse: 72  Temp: 98.7 F (37.1 C)  Resp: 16   GEN: In NAD; WN,WD. Weight is stable. HENT: [place]/AT; EOMI w/ clear conj/sclerae. Otherwise unremarkble. COR: RRR. LUNGS: Normal resp rate and effort. SKIN: W&D; intact w/o diaphoresis, erythema or pallor. NEURO: A&O x 3; CNs intact. Nonfocal. PSYCH: Pleasant mood and calm demeanor. Conversant, attentive w/ good eye contact. Speech pattern and thought content appropriate. Judgement sound. Depression screen score= 9.  A/P: Depression- Discontinue Fluoxetine. Start Wellbutrin XL 150 mg every morning. Contact me via MyChart to let me know how you are doing w/ this medication. Dose can be increased if necessary.  Meds ordered this encounter  Medications         . buPROPion (WELLBUTRIN XL) 150 MG 24 hr tablet    Sig: Take 1 tablet (150 mg total) by mouth daily.    Dispense:  30 tablet    Refill:  5

## 2013-08-20 ENCOUNTER — Ambulatory Visit (INDEPENDENT_AMBULATORY_CARE_PROVIDER_SITE_OTHER): Payer: BC Managed Care – PPO | Admitting: Family Medicine

## 2013-08-20 ENCOUNTER — Encounter: Payer: Self-pay | Admitting: Family Medicine

## 2013-08-20 VITALS — BP 100/60 | HR 64 | Temp 98.5°F | Resp 16 | Ht 65.0 in | Wt 140.2 lb

## 2013-08-20 DIAGNOSIS — F3289 Other specified depressive episodes: Secondary | ICD-10-CM

## 2013-08-20 DIAGNOSIS — F329 Major depressive disorder, single episode, unspecified: Secondary | ICD-10-CM

## 2013-08-20 DIAGNOSIS — G5600 Carpal tunnel syndrome, unspecified upper limb: Secondary | ICD-10-CM

## 2013-08-20 DIAGNOSIS — F32A Depression, unspecified: Secondary | ICD-10-CM

## 2013-08-20 MED ORDER — METHYLPREDNISOLONE (PAK) 4 MG PO TABS: ORAL_TABLET | ORAL | Status: DC

## 2013-08-20 NOTE — Patient Instructions (Addendum)
You can take Wellbutrin XL 150 mg  2 tablets once a day to see if the increase dose helps. Maximum dose is actually 450 mg daily.  I have ordered a referral to Dr. Fredna Dow for Carpel Tunnel Syndrome; you can take Aleve 2 tablets twice daily with food. Wear the wrist protector as much as you can.   Take the prednisone dose pack as per package directions.

## 2013-08-20 NOTE — Progress Notes (Signed)
S:  This 38 y.o. Cauc female has depression and sees a therapist weekly. Wellbutrin XL 150 mg 1 tablet daily was started in April but pt finds this dose ineffective. Her therapist recommends she increase dose to 300 mg daily; she is willing to try that before quitting the medication. Issues at home are essentially unchanged.  Pt c/o bilateral wrist pain, intermittent for 2-3 years. Now she has persistent bilateral wrist and hand pain (R>>L). She is R-hand dominant. Wearing a soft R wrist splint helps some but she is awaken at 4 AM with severe R wrist/hand pain. She works on a Teaching laboratory technician. Writ and hand pain increases w/ driving and other activities where her wrists are flexed. She denies weakness or dropping items.  Pain and numbness extend into to forearms. She has not taken any OTC medications.  Patient Active Problem List   Diagnosis Date Noted  . Family history of melanoma 05/12/2013  . Depression 03/18/2012  . Thyroid dysfunction 06/13/2011   Prior to Admission medications   Medication Sig Start Date End Date Taking? Authorizing Provider  buPROPion (WELLBUTRIN XL) 150 MG 24 hr tablet Take 1 tablet (150 mg total) by mouth daily. 07/09/13  Yes Barton Fanny, MD  cetirizine (ZYRTEC) 10 MG tablet Take 10 mg by mouth daily.   Yes Historical Provider, MD  cholecalciferol (VITAMIN D) 1000 UNITS tablet Take 1,000 Units by mouth daily.   Yes Historical Provider, MD  ferrous fumarate (HEMOCYTE - 106 MG FE) 325 (106 FE) MG TABS tablet Take 1 tablet by mouth.   Yes Historical Provider, MD  Levonorgestrel (MIRENA IU) by Intrauterine route.   Yes Historical Provider, MD  Multiple Vitamin (MULTIVITAMIN) tablet Take 1 tablet by mouth daily.   Yes Historical Provider, MD   PMHx, Surg Hx, Soc and Fam Hx reviewed.  ROS: AS per HPI. No reports of agitation, behavior problems, hallucinations, confusion, thoughts of self-harm, SI/HI.  O: Filed Vitals:   08/20/13 1018  BP: 100/60  Pulse: 64  Temp: 98.5  F (36.9 C)  Resp: 16   GEN: In NAD; WN,WD. HENT: [place]/AT; EOMI w/ clear conj/sclerae. Otherwise unremarkable. COR: RRR. LUNGS: Unlabored resp. SKIN: W&D; intact w/o diaphoresis, erythema or paloor. MS: Upper ext- forearms and wrists w/ normal appearance; FROM w/o swelling or deformities. No bony tenderness. Hands w/o deformities; no thenar muscle atrophy. Oppositional strength good.  NEURO: A&O x 3; CNs intact. Upper ext- Tinel +; Phalen test +/-. Normal motor function in hands. PSYCH: PHQ-9 score down from 9 to 4.  Results for orders placed in visit on 05/07/13  CBC WITH DIFFERENTIAL      Result Value Ref Range   WBC 5.6  4.0 - 10.5 K/uL   RBC 4.27  3.87 - 5.11 MIL/uL   Hemoglobin 12.9  12.0 - 15.0 g/dL   HCT 37.7  36.0 - 46.0 %   MCV 88.3  78.0 - 100.0 fL   MCH 30.2  26.0 - 34.0 pg   MCHC 34.2  30.0 - 36.0 g/dL   RDW 13.1  11.5 - 15.5 %   Platelets 269  150 - 400 K/uL   Neutrophils Relative % 56  43 - 77 %   Neutro Abs 3.1  1.7 - 7.7 K/uL   Lymphocytes Relative 30  12 - 46 %   Lymphs Abs 1.7  0.7 - 4.0 K/uL   Monocytes Relative 11  3 - 12 %   Monocytes Absolute 0.6  0.1 - 1.0 K/uL  Eosinophils Relative 2  0 - 5 %   Eosinophils Absolute 0.1  0.0 - 0.7 K/uL   Basophils Relative 1  0 - 1 %   Basophils Absolute 0.1  0.0 - 0.1 K/uL   Smear Review Criteria for review not met    TSH      Result Value Ref Range   TSH 2.720  0.350 - 4.500 uIU/mL  T3, FREE      Result Value Ref Range   T3, Free 3.0  2.3 - 4.2 pg/mL  T4, FREE      Result Value Ref Range   Free T4 0.65 (*) 0.80 - 1.80 ng/dL  LIPID PANEL      Result Value Ref Range   Cholesterol 152  0 - 200 mg/dL   Triglycerides 78  <150 mg/dL   HDL 49  >39 mg/dL   Total CHOL/HDL Ratio 3.1     VLDL 16  0 - 40 mg/dL   LDL Cholesterol 87  0 - 99 mg/dL  COMPLETE METABOLIC PANEL WITH GFR      Result Value Ref Range   Sodium 138  135 - 145 mEq/L   Potassium 4.2  3.5 - 5.3 mEq/L   Chloride 103  96 - 112 mEq/L   CO2 29  19 -  32 mEq/L   Glucose, Bld 91  70 - 99 mg/dL   BUN 12  6 - 23 mg/dL   Creat 0.67  0.50 - 1.10 mg/dL   Total Bilirubin 0.4  0.2 - 1.2 mg/dL   Alkaline Phosphatase 44  39 - 117 U/L   AST 18  0 - 37 U/L   ALT 15  0 - 35 U/L   Total Protein 7.0  6.0 - 8.3 g/dL   Albumin 4.5  3.5 - 5.2 g/dL   Calcium 9.3  8.4 - 10.5 mg/dL   GFR, Est African American >89     GFR, Est Non African American >89    VITAMIN D 25 HYDROXY      Result Value Ref Range   Vit D, 25-Hydroxy 45  30 - 89 ng/mL    A/P: Depression- Increase Wellbutrin XL to 300 mg once a day(take 2 of the 150 mg tabs for a few weeks). Continue weekly visits w/ therapist. Contact me if this dose is effective. RTC for follow-up in 8 weeks.  Carpal tunnel syndrome - Plan: Ambulatory referral to Hand Surgery (Dr. Fredna Dow).  Medrol dose pack and OTC Aleve 1-2 tabs bid w/ food. Wear wrist splints.  Meds ordered this encounter  Medications  . methylPREDNIsolone (MEDROL DOSPACK) 4 MG tablet    Sig: follow package directions    Dispense:  21 tablet    Refill:  0

## 2013-09-01 ENCOUNTER — Other Ambulatory Visit: Payer: Self-pay | Admitting: Family Medicine

## 2013-09-01 MED ORDER — CITALOPRAM HYDROBROMIDE 20 MG PO TABS: 20.0000 mg | ORAL_TABLET | Freq: Every day | ORAL | Status: DC

## 2013-09-20 ENCOUNTER — Other Ambulatory Visit: Payer: Self-pay | Admitting: Family Medicine

## 2013-09-29 ENCOUNTER — Other Ambulatory Visit: Payer: Self-pay | Admitting: Family Medicine

## 2013-09-29 MED ORDER — METHYLPREDNISOLONE (PAK) 4 MG PO TABS: ORAL_TABLET | ORAL | Status: DC

## 2013-10-12 DIAGNOSIS — Z0271 Encounter for disability determination: Secondary | ICD-10-CM

## 2013-10-15 ENCOUNTER — Encounter: Payer: Self-pay | Admitting: Family Medicine

## 2013-10-15 ENCOUNTER — Ambulatory Visit: Payer: BC Managed Care – PPO | Admitting: Family Medicine

## 2013-10-15 ENCOUNTER — Ambulatory Visit (INDEPENDENT_AMBULATORY_CARE_PROVIDER_SITE_OTHER): Payer: BC Managed Care – PPO | Admitting: Family Medicine

## 2013-10-15 VITALS — BP 90/62 | HR 59 | Temp 97.7°F | Resp 16 | Ht 65.0 in | Wt 138.0 lb

## 2013-10-15 DIAGNOSIS — F329 Major depressive disorder, single episode, unspecified: Secondary | ICD-10-CM

## 2013-10-15 DIAGNOSIS — F32A Depression, unspecified: Secondary | ICD-10-CM

## 2013-10-15 DIAGNOSIS — F3289 Other specified depressive episodes: Secondary | ICD-10-CM

## 2013-10-15 NOTE — Progress Notes (Signed)
S:  This 38 y.o. Cauc female has depression and is doing well on Citalopram. Her therapist asked her how happy she felt on a 1-10 scale w/ 10 being "very unhappy". Pt feels like she is at "3" and is comfortable with the range of emotions she feels. She feels like her normal self. Her therapist has concerns that pt has reached a "plateau" and wants pt at or close to "1" on the happiness rating. Pt does not want to increase her medication dose, stating that she is comfortable where she is. She recalls on Prozac that she was "numb" and nothing bothered her or affected her in any way. On Wellbutrin, the increased dose was not tolerated well. PHQ-9 questions to which she answered "several days" on 08/20/2013- responses are now "a few days". Pt wants to remain at current medication dose while continuing in therapy and working through other life issues.  Patient Active Problem List   Diagnosis Date Noted  . Carpal tunnel syndrome 08/20/2013  . Family history of melanoma 05/12/2013  . Depression 03/18/2012  . Thyroid dysfunction 06/13/2011    Outpatient Encounter Prescriptions as of 10/15/2013  Medication Sig  . cetirizine (ZYRTEC) 10 MG tablet Take 10 mg by mouth daily.  . cholecalciferol (VITAMIN D) 1000 UNITS tablet Take 1,000 Units by mouth daily.  . citalopram (CELEXA) 20 MG tablet Take 1 tablet (20 mg total) by mouth daily.  . ferrous fumarate (HEMOCYTE - 106 MG FE) 325 (106 FE) MG TABS tablet Take 1 tablet by mouth.  . Levonorgestrel (MIRENA IU) by Intrauterine route.  . Multiple Vitamin (MULTIVITAMIN) tablet Take 1 tablet by mouth daily.  . methylPREDNIsolone (MEDROL DOSPACK) 4 MG tablet follow package directions    Allergies  Allergen Reactions  . Amoxicillin Hives  . Cephalexin Hives    History   Social History  . Marital Status: Married    Spouse Name: N/A    Number of Children: N/A  . Years of Education: N/A   Occupational History  . Not on file.   Social History Main Topics   . Smoking status: Never Smoker   . Smokeless tobacco: Never Used  . Alcohol Use: No  . Drug Use: Not on file  . Sexual Activity: Yes    Birth Control/ Protection: None     Comment: Husband had vasectomy   Other Topics Concern  . Not on file   Social History Narrative   Fall 2014- separated from husband; "special needs" pre-adolescent son. Currently not working to be at home and address son's issues.    ROS: As per HPI; negative for anorexia (appetite usually less in the summer), diaphoresis, fatigue, GI upset, HA, dizziness, agitation, sleep disturbance, thoughts of self injury, SI/HI.  O: Filed Vitals:   10/15/13 0831  BP: 90/62  Pulse: 59  Temp: 97.7 F (36.5 C)  Resp: 16   GEN: in NAD; WN,WD. Weight down 2 lbs. HENT: [place]/AT; EOMI w/ clear conj/sclerae. Otherwise unremarkable. COR: RRR. LUNGS: Unlabored resp. SKIN: W&D; intact w/o diaphoresis, erythema or pallor. NEURO: A&O x 3; CNs intact. Nonfocal. PSYCH: Pleasant and calm; seems to be "happy"- laughing and engaged in conversation/ smiling. Good eye contact and attentive. Speech and thought pattern normal.  A/P: Depression- Agree w/ pt's decision to remain at current dose while working on life issues. Pt feels "happier" than she has ain a long time and feels "normal".  RTC in 6 months for CPE/labs.  Pt can contact me through MyChart if she has  any questions or concerns.

## 2013-11-17 ENCOUNTER — Encounter: Payer: Self-pay | Admitting: Family Medicine

## 2013-11-18 ENCOUNTER — Other Ambulatory Visit: Payer: Self-pay | Admitting: Family Medicine

## 2013-11-18 MED ORDER — CITALOPRAM HYDROBROMIDE 40 MG PO TABS: 40.0000 mg | ORAL_TABLET | Freq: Every day | ORAL | Status: DC

## 2014-04-20 ENCOUNTER — Ambulatory Visit: Payer: BC Managed Care – PPO | Admitting: Family Medicine

## 2014-05-13 ENCOUNTER — Ambulatory Visit (INDEPENDENT_AMBULATORY_CARE_PROVIDER_SITE_OTHER): Payer: BLUE CROSS/BLUE SHIELD | Admitting: Family Medicine

## 2014-05-13 ENCOUNTER — Encounter: Payer: Self-pay | Admitting: Family Medicine

## 2014-05-13 VITALS — BP 99/58 | HR 66 | Temp 97.8°F | Resp 16 | Ht 65.0 in | Wt 145.8 lb

## 2014-05-13 DIAGNOSIS — F329 Major depressive disorder, single episode, unspecified: Secondary | ICD-10-CM

## 2014-05-13 DIAGNOSIS — F32A Depression, unspecified: Secondary | ICD-10-CM

## 2014-05-13 MED ORDER — CITALOPRAM HYDROBROMIDE 40 MG PO TABS: 40.0000 mg | ORAL_TABLET | Freq: Every day | ORAL | Status: AC

## 2014-05-13 NOTE — Progress Notes (Signed)
S;  This 39 y.o. Female has chronic depression in remission; she takes citalopram 40 mg daily w/o adverse effects. The medication is effective; sleep hygiene has improved since her 39 y.o. son is sleeping through the night. His sleep disorder has been a big stressor. Pt denies abnormal weight change, fatigue, anorexia, dry mouth, increased sweating, SOB, GI problems, arthralgias or myalgias, HA, dizziness, agitation or tremor.  Patient Active Problem List   Diagnosis Date Noted  . Carpal tunnel syndrome 08/20/2013  . Family history of melanoma 05/12/2013  . Depression 03/18/2012  . Thyroid dysfunction 06/13/2011    Prior to Admission medications   Medication Sig Start Date End Date Taking? Authorizing Provider  cetirizine (ZYRTEC) 10 MG tablet Take 10 mg by mouth daily.   Yes Historical Provider, MD  citalopram (CELEXA) 40 MG tablet Take 1 tablet (40 mg total) by mouth daily.   Yes Barton Fanny, MD  ferrous fumarate (HEMOCYTE - 106 MG FE) 325 (106 FE) MG TABS tablet Take 1 tablet by mouth.   Yes Historical Provider, MD  Levonorgestrel (MIRENA IU) by Intrauterine route.   Yes Historical Provider, MD  Multiple Vitamin (MULTIVITAMIN) tablet Take 1 tablet by mouth daily.   Yes Historical Provider, MD  cholecalciferol (VITAMIN D) 1000 UNITS tablet Take 1,000 Units by mouth daily.    Historical Provider, MD    Past Surgical History  Procedure Laterality Date  . Back surgery  05/01/2009    HERNIATED DISK    SOC and FAM HX reviewed.  ROS: As per HPI.  O: Filed Vitals:   05/13/14 0821  BP: 99/58  Pulse: 66  Temp: 97.8 F (36.6 C)  Resp: 16    GEN: In NAD; WN,WD. HENT: [place]/AT; EOMI w/ clear conj/sclerae. Otherwise unremarkable. COR: RRR. LUNGS: Unlabored resp. SKIN; W&D; intact. No erythema, jaundice or pallor. NEURO: A&O x 3; Cns intact. Nonfocal.  A/P: Depression- Continue citalopram 40 mg daily; refill x 12 months.  Meds ordered this encounter  Medications  .  citalopram (CELEXA) 40 MG tablet    Sig: Take 1 tablet (40 mg total) by mouth daily.    Dispense:  30 tablet    Refill:  11

## 2015-05-26 ENCOUNTER — Other Ambulatory Visit: Payer: Self-pay

## 2015-05-26 DIAGNOSIS — Z1231 Encounter for screening mammogram for malignant neoplasm of breast: Secondary | ICD-10-CM

## 2015-06-12 ENCOUNTER — Ambulatory Visit
Admission: RE | Admit: 2015-06-12 | Discharge: 2015-06-12 | Disposition: A | Discharge status: Home / Self Care | Payer: BLUE CROSS/BLUE SHIELD | Source: Ambulatory Visit

## 2015-06-12 DIAGNOSIS — Z1231 Encounter for screening mammogram for malignant neoplasm of breast: Secondary | ICD-10-CM

## 2017-10-22 NOTE — Progress Notes (Signed)
Office Visit Note  Patient: Judith Gonzalez             Date of Birth: 18-Jun-1975           MRN: [id number]             PCP: Shanon Rosser, PA-C Referring: Barton Fanny, MD Visit Date: 10/23/2017 Occupation: @GUAROCC @  Subjective:  Tingling and numbness in bilateral hands.   History of Present Illness: Judith Gonzalez is a 42 y.o. female with history of bilateral carpal tunnel syndrome.  She returns today after her last visit in June 2017.  She states after the bilateral carpal tunnel syndrome injections she did really well until now.  Now she is having pain and tingling in her bilateral hands.  It is waking her up in the middle of the night and pain is radiating up her forearm.  The trochanteric bursitis has resolved.  She had nerve conduction velocities to bilateral hands in April 2017 by Dr. Ernestina Patches which showed bilateral moderate carpal tunnel syndrome with sensory and motor component.  She opted to have cortisone injections which were performed on August 30, 2015.  Activities of Daily Living:  Patient reports morning stiffness for 0 minute.   Patient Reports nocturnal pain.  Difficulty dressing/grooming: Denies Difficulty climbing stairs: Denies Difficulty getting out of chair: Denies Difficulty using hands for taps, buttons, cutlery, and/or writing: Reports  Review of Systems  Constitutional: Negative for fatigue, night sweats, weight gain and weight loss.  HENT: Negative for mouth sores, trouble swallowing, trouble swallowing, mouth dryness and nose dryness.   Eyes: Negative for pain, redness, visual disturbance and dryness.  Respiratory: Negative for cough, shortness of breath and difficulty breathing.   Cardiovascular: Negative for chest pain, palpitations, hypertension, irregular heartbeat and swelling in legs/feet.  Gastrointestinal: Negative for blood in stool, constipation and diarrhea.  Endocrine: Negative for increased urination.  Genitourinary: Negative  for vaginal dryness.  Musculoskeletal: Negative for arthralgias, joint pain, joint swelling, myalgias, muscle weakness, morning stiffness, muscle tenderness and myalgias.  Skin: Negative for color change, rash, hair loss, skin tightness, ulcers and sensitivity to sunlight.  Allergic/Immunologic: Negative for susceptible to infections.  Neurological: Negative for dizziness, memory loss, night sweats and weakness.  Hematological: Negative for swollen glands.  Psychiatric/Behavioral: Positive for depressed mood. Negative for sleep disturbance. The patient is not nervous/anxious.     PMFS History:  Patient Active Problem List   Diagnosis Date Noted  . Carpal tunnel syndrome 08/20/2013  . Family history of melanoma 05/12/2013  . Depression 03/18/2012  . Thyroid dysfunction 06/13/2011    Past Medical History:  Diagnosis Date  . Anxiety   . Depression   . Thyroid disease     Family History  Problem Relation Age of Onset  . Heart disease Father 37       STINTS   . Clotting disorder Father 81  . COPD Mother   . Skin cancer Brother   . Glaucoma Brother   . Hyperthyroidism Sister    Past Surgical History:  Procedure Laterality Date  . BACK SURGERY  05/01/2009   HERNIATED DISK  . TONSILLECTOMY     age 34   Social History   Social History Narrative   Fall 2014- separated from husband; "special needs" pre-adolescent son. Currently not working to be at home and address son's issues.    Objective: Vital Signs: BP 106/70 (BP Location: Left Arm, Patient Position: Sitting, Cuff Size: Normal)   Pulse 71  Resp 15   Ht 5' 5"  (1.651 m)   Wt 149 lb 6.4 oz (67.8 kg)   BMI 24.86 kg/m    Physical Exam  Constitutional: She is oriented to person, place, and time. She appears well-developed and well-nourished.  HENT:  Head: Normocephalic and atraumatic.  Eyes: Conjunctivae and EOM are normal.  Neck: Normal range of motion.  Cardiovascular: Normal rate, regular rhythm, normal heart  sounds and intact distal pulses.  Pulmonary/Chest: Effort normal and breath sounds normal.  Abdominal: Soft. Bowel sounds are normal.  Lymphadenopathy:    She has no cervical adenopathy.  Neurological: She is alert and oriented to person, place, and time.  Skin: Skin is warm and dry. Capillary refill takes less than 2 seconds.  Psychiatric: She has a normal mood and affect. Her behavior is normal.  Nursing note and vitals reviewed.    Musculoskeletal Exam: C-spine thoracic lumbar spine good range of motion.  Shoulder joints elbow joints wrist joint MCPs PIPs DIPs were in good range of motion with no synovitis.  Hip joints knee joints ankles MTPs PIPs were in good range of motion with no synovitis.  Manual compression test, Tinel's and Phalen's test were positive.  CDAI Exam: CDAI Homunculus Exam:   Joint Counts:  CDAI Tender Joint count: 0 CDAI Swollen Joint count: 0    Investigation: No additional findings.  Imaging: No results found.  Recent Labs: Lab Results  Component Value Date   WBC 5.6 05/07/2013   HGB 12.9 05/07/2013   PLT 269 05/07/2013   NA 138 05/07/2013   K 4.2 05/07/2013   CL 103 05/07/2013   CO2 29 05/07/2013   GLUCOSE 91 05/07/2013   BUN 12 05/07/2013   CREATININE 0.67 05/07/2013   BILITOT 0.4 05/07/2013   ALKPHOS 44 05/07/2013   AST 18 05/07/2013   ALT 15 05/07/2013   PROT 7.0 05/07/2013   ALBUMIN 4.5 05/07/2013   CALCIUM 9.3 05/07/2013   GFRAA >89 05/07/2013    Speciality Comments: No specialty comments available.  Procedures:  Hand/UE Inj: bilateral carpal tunnel for carpal tunnel syndrome on 10/23/2017 4:00 PM Indications: pain Details: 27 G needle, ultrasound-guided ulnar approach Medications (Right): 0.5 mL lidocaine 1 %; 10 mg triamcinolone acetonide 40 MG/ML Medications (Left): 0.5 mL lidocaine 1 %; 10 mg triamcinolone acetonide 40 MG/ML Outcome: tolerated well, no immediate complications Procedure, treatment alternatives, risks and  benefits explained, specific risks discussed. Consent was given by the patient. Immediately prior to procedure a time out was called to verify the correct patient, procedure, equipment, support staff and site/side marked as required. Patient was prepped and draped in the usual sterile fashion.     Allergies: Amoxicillin and Cephalexin   Assessment / Plan:     Visit Diagnoses: Bilateral carpal tunnel syndrome-it is having severe symptoms of bilateral carpal tunnel syndrome.  She had nerve conduction velocities in April 2017 Which Showed Moderate  bilateral carpal tunnel syndrome with sensory and motor component.  At the time surgery was suggested but patient declined.  She had good response to cortisone injection.  She wants to have cortisone injection again today.  As per patient's request bilateral carpal tunnel injections were performed as described above.  She tolerated the procedure well.  I have advised her to use carpal tunnel braces at home.  If her symptoms persist she should notify us and may consider surgery.  History of lumbar spinal fusion - injury.  Her lower back pain is not as bothersome currently.  History  of vitamin D deficiency -patient states that she has been taking vitamin D on a regular basis.  History of depression  History of hypothyroidism   Orders: Orders Placed This Encounter  Procedures  . Hand/UE Inj   No orders of the defined types were placed in this encounter.   .  Follow-Up Instructions: Return if symptoms worsen or fail to improve, for CTS.   Bo Merino, MD  Note - This record has been created using Editor, commissioning.  Chart creation errors have been sought, but may not always  have been located. Such creation errors do not reflect on  the standard of medical care.

## 2017-10-23 ENCOUNTER — Encounter: Payer: Self-pay | Admitting: Rheumatology

## 2017-10-23 ENCOUNTER — Ambulatory Visit: Payer: BLUE CROSS/BLUE SHIELD | Admitting: Rheumatology

## 2017-10-23 VITALS — BP 106/70 | HR 71 | Resp 15 | Ht 65.0 in | Wt 149.4 lb

## 2017-10-23 DIAGNOSIS — G5603 Carpal tunnel syndrome, bilateral upper limbs: Secondary | ICD-10-CM

## 2017-10-23 DIAGNOSIS — Z8639 Personal history of other endocrine, nutritional and metabolic disease: Secondary | ICD-10-CM

## 2017-10-23 DIAGNOSIS — Z8659 Personal history of other mental and behavioral disorders: Secondary | ICD-10-CM

## 2017-10-23 DIAGNOSIS — Z981 Arthrodesis status: Secondary | ICD-10-CM | POA: Diagnosis not present

## 2017-10-23 MED ORDER — TRIAMCINOLONE ACETONIDE 40 MG/ML IJ SUSP
10.0000 mg | INTRAMUSCULAR | Status: AC | PRN
  Administered 2017-10-23: 10 mg

## 2017-10-23 MED ORDER — LIDOCAINE HCL 1 % IJ SOLN
0.5000 mL | INTRAMUSCULAR | Status: AC | PRN
Start: 2017-10-23 — End: 2017-10-23
  Administered 2017-10-23: .5 mL

## 2017-10-23 MED ORDER — LIDOCAINE HCL 1 % IJ SOLN
0.5000 mL | INTRAMUSCULAR | Status: AC | PRN
  Administered 2017-10-23: .5 mL

## 2018-10-30 ENCOUNTER — Other Ambulatory Visit: Payer: Self-pay | Admitting: Anatomic Pathology & Clinical Pathology

## 2018-10-30 ENCOUNTER — Other Ambulatory Visit: Payer: Self-pay | Admitting: Gastroenterology

## 2018-10-30 DIAGNOSIS — R7989 Other specified abnormal findings of blood chemistry: Secondary | ICD-10-CM

## 2018-11-02 ENCOUNTER — Ambulatory Visit
Admission: RE | Admit: 2018-11-02 | Discharge: 2018-11-02 | Disposition: A | Discharge status: Home / Self Care | Payer: BC Managed Care – PPO | Source: Ambulatory Visit | Attending: Gastroenterology | Admitting: Gastroenterology

## 2018-11-02 DIAGNOSIS — R7989 Other specified abnormal findings of blood chemistry: Secondary | ICD-10-CM

## 2018-11-17 NOTE — Progress Notes (Signed)
Office Visit Note  Patient: Judith Gonzalez             Date of Birth: 03-02-1976           MRN: [id number]             PCP: Shanon Rosser, PA-C Referring: Shanon Rosser, PA-C Visit Date: 12/01/2018 Occupation: @GUAROCC @  Subjective:  Abnormal Lab (Dr. Collene Mares)   History of Present Illness: Judith Gonzalez is a 43 y.o. female with history of degenerative disc disease of lumbar spine.  She was previously seen for carpal tunnel syndrome and had carpal tunnel injections last year.  She states about couple of months ago she was feeling increased fatigue and was seen by her GYN.  She had some labs done and her TSH was normal but her liver functions were elevated.  She was referred to Dr. Collene Mares who did some extra blood work and found that she had positive ANA and referred her to me for further evaluation.  Patient states she was taking Aleve for couple tunnel syndrome symptoms.  And she discontinued Aleve after her visit with Dr. Collene Mares about a month ago.  She has not taken any NSAIDs since then.  Activities of Daily Living:  Patient reports morning stiffness for 0 none.   Patient Denies nocturnal pain.  Difficulty dressing/grooming: Denies Difficulty climbing stairs: Denies Difficulty getting out of chair: Denies Difficulty using hands for taps, buttons, cutlery, and/or writing: Denies  Review of Systems  Constitutional: Negative for fatigue, night sweats, weight gain and weight loss.  HENT: Negative for mouth sores, trouble swallowing, trouble swallowing, mouth dryness and nose dryness.   Eyes: Positive for dryness. Negative for pain, redness and visual disturbance.  Respiratory: Negative for cough, shortness of breath and difficulty breathing.   Cardiovascular: Negative for chest pain, palpitations, hypertension, irregular heartbeat and swelling in legs/feet.  Gastrointestinal: Negative for blood in stool, constipation and diarrhea.  Endocrine: Negative for increased urination.   Genitourinary: Negative for difficulty urinating and vaginal dryness.  Musculoskeletal: Positive for arthralgias and joint pain. Negative for joint swelling, myalgias, muscle weakness, morning stiffness, muscle tenderness and myalgias.  Skin: Negative for color change, rash, hair loss, skin tightness, ulcers and sensitivity to sunlight.  Allergic/Immunologic: Negative for susceptible to infections.  Neurological: Negative for dizziness, memory loss, night sweats and weakness.  Hematological: Negative for bruising/bleeding tendency and swollen glands.  Psychiatric/Behavioral: Negative for depressed mood and sleep disturbance. The patient is not nervous/anxious.     PMFS History:  Patient Active Problem List   Diagnosis Date Noted  . Carpal tunnel syndrome 08/20/2013  . Family history of melanoma 05/12/2013  . Depression 03/18/2012  . Thyroid dysfunction 06/13/2011    Past Medical History:  Diagnosis Date  . Anxiety   . Depression   . Thyroid disease     Family History  Problem Relation Age of Onset  . Heart disease Father 28       STINTS   . Clotting disorder Father 1  . COPD Mother   . Skin cancer Brother   . Glaucoma Brother   . Hyperthyroidism Sister    Past Surgical History:  Procedure Laterality Date  . BACK SURGERY  05/01/2009   HERNIATED DISK  . TONSILLECTOMY     age 60   Social History   Social History Narrative   Fall 2014- separated from husband; "special needs" pre-adolescent son. Currently not working to be at home and address son's issues.   Immunization History  Administered Date(s) Administered  . Influenza,inj,Quad PF,6+ Mos 12/17/2012  . Tdap 07/04/2011     Objective: Vital Signs: BP (!) 92/59 (BP Location: Left Arm, Patient Position: Sitting, Cuff Size: Normal)   Pulse 68   Resp 14   Ht 5' 5"  (1.651 m)   Wt 142 lb 4.8 oz (64.5 kg)   BMI 23.68 kg/m    Physical Exam Vitals signs and nursing note reviewed.  Constitutional:      Appearance:  She is well-developed.  HENT:     Head: Normocephalic and atraumatic.  Eyes:     Conjunctiva/sclera: Conjunctivae normal.  Neck:     Musculoskeletal: Normal range of motion.  Cardiovascular:     Rate and Rhythm: Normal rate and regular rhythm.     Heart sounds: Normal heart sounds.  Pulmonary:     Effort: Pulmonary effort is normal.     Breath sounds: Normal breath sounds.  Abdominal:     General: Bowel sounds are normal.     Palpations: Abdomen is soft.  Lymphadenopathy:     Cervical: No cervical adenopathy.  Skin:    General: Skin is warm and dry.     Capillary Refill: Capillary refill takes less than 2 seconds.  Neurological:     Mental Status: She is alert and oriented to person, place, and time.  Psychiatric:        Behavior: Behavior normal.      Musculoskeletal Exam: C-spine, shoulder joints, elbow joints, wrist joints, MCPs PIPs and DIPs were all in good range of motion.  Patient had good range of motion of her hip joints knee joints ankles MTPs PIPs with no synovitis.  CDAI Exam: CDAI Score: - Patient Global: -; Provider Global: - Swollen: -; Tender: - Joint Exam   No joint exam has been documented for this visit   There is currently no information documented on the homunculus. Go to the Rheumatology activity and complete the homunculus joint exam.  Investigation: No additional findings.  Imaging: US Abdomen Limited Ruq  Result Date: 11/02/2018 CLINICAL DATA:  Elevated liver function tests. EXAM: ULTRASOUND ABDOMEN LIMITED RIGHT UPPER QUADRANT COMPARISON:  None. FINDINGS: Gallbladder: A few small gallstones are seen measuring up to 8 mm. No evidence of gallbladder wall thickening or pericholecystic fluid. No sonographic Murphy sign noted by sonographer. Common bile duct: Diameter: 4 mm, within normal limits. Liver: No focal lesion identified. Within normal limits in parenchymal echogenicity. Portal vein is patent on color Doppler imaging with normal direction of  blood flow towards the liver. Other: None. IMPRESSION: Cholelithiasis. No sonographic signs of cholecystitis or biliary ductal dilatation. Normal sonographic appearance of liver. Electronically Signed   By: Marlaine Hind M.D.   On: 11/02/2018 11:39    Recent Labs: Lab Results  Component Value Date   WBC 5.6 05/07/2013   HGB 12.9 05/07/2013   PLT 269 05/07/2013   NA 138 05/07/2013   K 4.2 05/07/2013   CL 103 05/07/2013   CO2 29 05/07/2013   GLUCOSE 91 05/07/2013   BUN 12 05/07/2013   CREATININE 0.67 05/07/2013   BILITOT 0.4 05/07/2013   ALKPHOS 44 05/07/2013   AST 18 05/07/2013   ALT 15 05/07/2013   PROT 7.0 05/07/2013   ALBUMIN 4.5 05/07/2013   CALCIUM 9.3 05/07/2013   GFRAA >89 05/07/2013  Record review:  :October 27, 2018 labs done at Dr. Lorie Apley office showed ANA +1: 320 speckled, dsDNA, RNP, SSA, SSB, Smith, anticentromere, SCL 70, RNP were all negative, hepatitis A  was negative, iron studies were normal mitochondrial antibody was positive smooth muscle antibody was negative alpha-1 antitrypsin was negative October 21, 2018 CMP showed AST 43 ALT 66 GFR was normal  Speciality Comments: No specialty comments available.  Procedures:  No procedures performed Allergies: Amoxicillin and Cephalexin   Assessment / Plan:     Visit Diagnoses: Elevated LFTs -patient has elevated LFTs which may be related to taking anti-inflammatories.  She states she has been off anti-inflammatories for a month now.  We will check her liver functions today again.  I reviewed all the labs done by Dr. Collene Mares.  Plan: Hepatic function panel  Positive ANA (antinuclear antibody) -she has low titer ANA with speckled pattern.  ENA panel was completely negative.  She has no clinical features of autoimmune disease.  Plan: C3 and C4.  I will contact her once complement levels are available.  I have advised her to contact me if she develops any new symptoms which we discussed at length.  Bilateral carpal tunnel  syndrome-related to her work.  Her symptoms have improved now.  History of lumbar spinal fusion-she has intermittent discomfort.  History of vitamin D deficiency-she takes supplement.  History of depression-managed with the medications.  History of hypothyroidism-she is on thyroid medication.  Orders: Orders Placed This Encounter  Procedures  . Hepatic function panel  . C3 and C4   No orders of the defined types were placed in this encounter.     Follow-Up Instructions: Return in about 6 months (around 05/31/2019) for Positive ANA.   Bo Merino, MD  Note - This record has been created using [employer], [employer].  Chart creation errors have been sought, but may not always  have been located. Such creation errors do not reflect on  the standard of medical care.

## 2018-12-01 ENCOUNTER — Encounter: Payer: Self-pay | Admitting: Rheumatology

## 2018-12-01 ENCOUNTER — Other Ambulatory Visit: Payer: Self-pay

## 2018-12-01 ENCOUNTER — Ambulatory Visit: Payer: BC Managed Care – PPO | Admitting: Rheumatology

## 2018-12-01 VITALS — BP 92/59 | HR 68 | Resp 14 | Ht 65.0 in | Wt 142.3 lb

## 2018-12-01 DIAGNOSIS — G5603 Carpal tunnel syndrome, bilateral upper limbs: Secondary | ICD-10-CM

## 2018-12-01 DIAGNOSIS — R945 Abnormal results of liver function studies: Secondary | ICD-10-CM | POA: Diagnosis not present

## 2018-12-01 DIAGNOSIS — Z8639 Personal history of other endocrine, nutritional and metabolic disease: Secondary | ICD-10-CM

## 2018-12-01 DIAGNOSIS — R768 Other specified abnormal immunological findings in serum: Secondary | ICD-10-CM | POA: Diagnosis not present

## 2018-12-01 DIAGNOSIS — Z981 Arthrodesis status: Secondary | ICD-10-CM

## 2018-12-01 DIAGNOSIS — R7989 Other specified abnormal findings of blood chemistry: Secondary | ICD-10-CM

## 2018-12-01 DIAGNOSIS — Z8659 Personal history of other mental and behavioral disorders: Secondary | ICD-10-CM

## 2018-12-02 LAB — HEPATIC FUNCTION PANEL
AG Ratio: 1.7 (calc) (ref 1.0–2.5)
ALT: 21 U/L (ref 6–29)
AST: 22 U/L (ref 10–30)
Albumin: 4.5 g/dL (ref 3.6–5.1)
Alkaline phosphatase (APISO): 65 U/L (ref 31–125)
Bilirubin, Direct: 0.1 mg/dL (ref 0.0–0.2)
Globulin: 2.6 g/dL (calc) (ref 1.9–3.7)
Indirect Bilirubin: 0.3 mg/dL (calc) (ref 0.2–1.2)
Total Bilirubin: 0.4 mg/dL (ref 0.2–1.2)
Total Protein: 7.1 g/dL (ref 6.1–8.1)

## 2018-12-02 LAB — C3 AND C4
C3 Complement: 91 mg/dL (ref 83–193)
C4 Complement: 17 mg/dL (ref 15–57)

## 2018-12-02 NOTE — Progress Notes (Signed)
Please see Dr. Arlean Hopping above note.

## 2018-12-02 NOTE — Progress Notes (Signed)
Please notify patient that the liver functions are normal.  They were most likely related to NSAIDs.  Please forward copy of these labs to Dr. Collene Mares.

## 2019-05-05 ENCOUNTER — Other Ambulatory Visit: Payer: Self-pay | Admitting: Surgery

## 2019-05-28 NOTE — Progress Notes (Deleted)
Office Visit Note  Patient: Judith Gonzalez             Date of Birth: 09-25-1975           MRN: [id number]             PCP: Shanon Rosser, PA-C Referring: Shanon Rosser, PA-C Visit Date: 06/03/2019 Occupation: @GUAROCC @  Subjective:  No chief complaint on file.   History of Present Illness: Judith Gonzalez is a 44 y.o. female ***   Activities of Daily Living:  Patient reports morning stiffness for *** {minute/hour:19697}.   Patient {ACTIONS;DENIES/REPORTS:21021675::"Denies"} nocturnal pain.  Difficulty dressing/grooming: {ACTIONS;DENIES/REPORTS:21021675::"Denies"} Difficulty climbing stairs: {ACTIONS;DENIES/REPORTS:21021675::"Denies"} Difficulty getting out of chair: {ACTIONS;DENIES/REPORTS:21021675::"Denies"} Difficulty using hands for taps, buttons, cutlery, and/or writing: {ACTIONS;DENIES/REPORTS:21021675::"Denies"}  No Rheumatology ROS completed.   PMFS History:  Patient Active Problem List   Diagnosis Date Noted  . Carpal tunnel syndrome 08/20/2013  . Family history of melanoma 05/12/2013  . Depression 03/18/2012  . Thyroid dysfunction 06/13/2011    Past Medical History:  Diagnosis Date  . Anxiety   . Depression   . Thyroid disease     Family History  Problem Relation Age of Onset  . Heart disease Father 28       STINTS   . Clotting disorder Father 35  . COPD Mother   . Skin cancer Brother   . Glaucoma Brother   . Hyperthyroidism Sister    Past Surgical History:  Procedure Laterality Date  . BACK SURGERY  05/01/2009   HERNIATED DISK  . TONSILLECTOMY     age 89   Social History   Social History Narrative   Fall 2014- separated from husband; "special needs" pre-adolescent son. Currently not working to be at home and address son's issues.   Immunization History  Administered Date(s) Administered  . Influenza,inj,Quad PF,6+ Mos 12/17/2012  . Tdap 07/04/2011     Objective: Vital Signs: There were no vitals taken for this visit.   Physical  Exam   Musculoskeletal Exam: ***  CDAI Exam: CDAI Score: -- Patient Global: --; Provider Global: -- Swollen: --; Tender: -- Joint Exam 06/03/2019   No joint exam has been documented for this visit   There is currently no information documented on the homunculus. Go to the Rheumatology activity and complete the homunculus joint exam.  Investigation: No additional findings.  Imaging: No results found.  Recent Labs: Lab Results  Component Value Date   WBC 5.6 05/07/2013   HGB 12.9 05/07/2013   PLT 269 05/07/2013   NA 138 05/07/2013   K 4.2 05/07/2013   CL 103 05/07/2013   CO2 29 05/07/2013   GLUCOSE 91 05/07/2013   BUN 12 05/07/2013   CREATININE 0.67 05/07/2013   BILITOT 0.4 12/01/2018   ALKPHOS 44 05/07/2013   AST 22 12/01/2018   ALT 21 12/01/2018   PROT 7.1 12/01/2018   ALBUMIN 4.5 05/07/2013   CALCIUM 9.3 05/07/2013   GFRAA >89 05/07/2013    Speciality Comments: No specialty comments available.  Procedures:  No procedures performed Allergies: Amoxicillin and Cephalexin   Assessment / Plan:     Visit Diagnoses: No diagnosis found.  Orders: No orders of the defined types were placed in this encounter.  No orders of the defined types were placed in this encounter.   Face-to-face time spent with patient was *** minutes. Greater than 50% of time was spent in counseling and coordination of care.  Follow-Up Instructions: No follow-ups on file.   Ofilia Neas,  PA-C  Note - This record has been created using [employer].  Chart creation errors have been sought, but may not always  have been located. Such creation errors do not reflect on  the standard of medical care.

## 2019-06-03 ENCOUNTER — Ambulatory Visit: Payer: BC Managed Care – PPO | Admitting: Rheumatology

## 2020-03-04 ENCOUNTER — Ambulatory Visit: Payer: BC Managed Care – PPO | Attending: Internal Medicine

## 2020-03-04 DIAGNOSIS — Z23 Encounter for immunization: Secondary | ICD-10-CM

## 2020-03-04 NOTE — Progress Notes (Signed)
   Covid-19 Vaccination [facility]  Name:  Judith Gonzalez    MRN: [id number] DOB: 12-17-1975  03/04/2020  [name] was observed post Covid-19 immunization for 15 minutes without incident. She was provided with Vaccine Information Sheet and instruction to access the V-Safe system.   [name] was instructed to call 911 with any severe reactions post vaccine: Marland Kitchen Difficulty breathing  . Swelling of face and throat  . A fast heartbeat  . A bad rash all over body  . Dizziness and weakness   Immunizations Administered    Name Date Dose VIS Date Route   Pfizer COVID-19 Vaccine 03/04/2020 10:11 AM 0.3 mL 01/05/2020 Intramuscular   Manufacturer: [place]   Lot: [id number]   [place]: [id number]

## 2020-04-24 IMAGING — US ULTRASOUND ABDOMEN LIMITED
1 series · 14 of 25 positions shown · non-contrast
Comparison: None.

CLINICAL DATA: Elevated liver function tests.

EXAM:
ULTRASOUND ABDOMEN LIMITED RIGHT UPPER QUADRANT

[Series 1: ultrasound abdomen limited · 0.15mm/px · 14 of 58 slices shown]
[im 1/58]
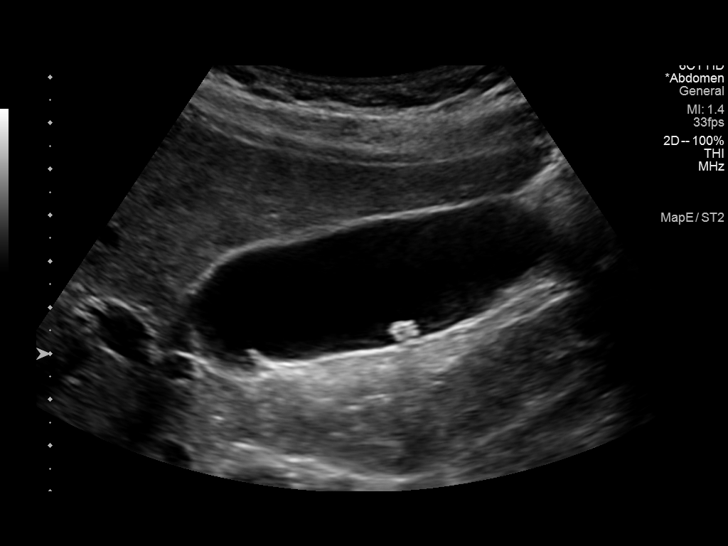
[im 5/58]
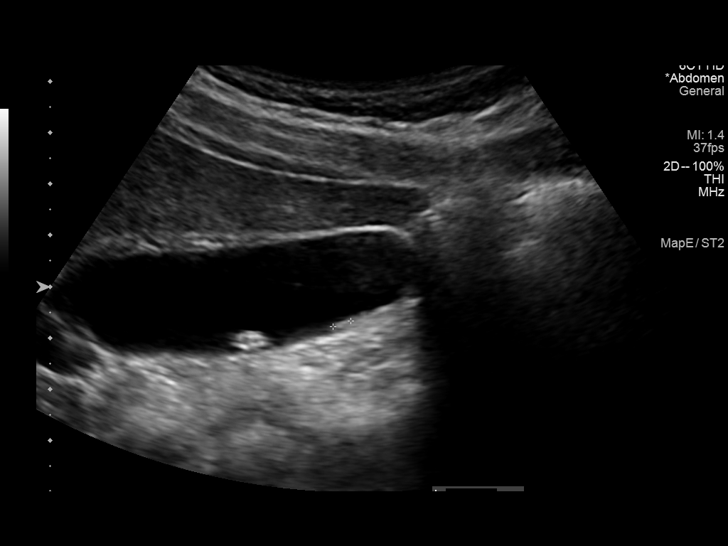
[im 10/58]
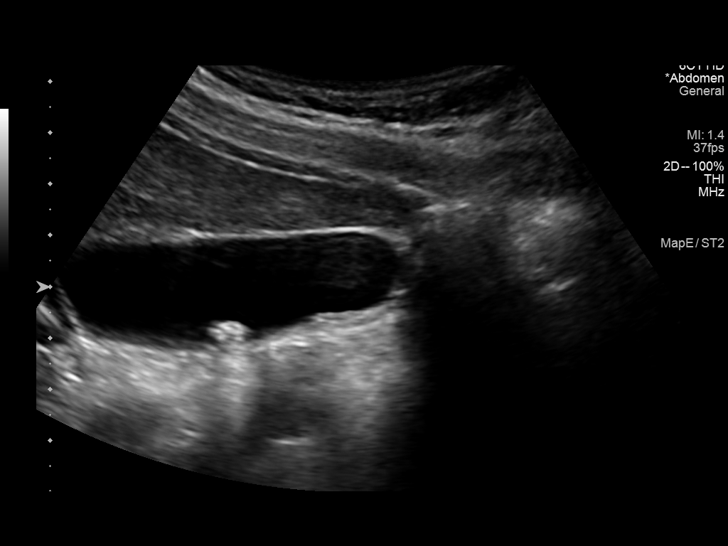
[im 15/58]
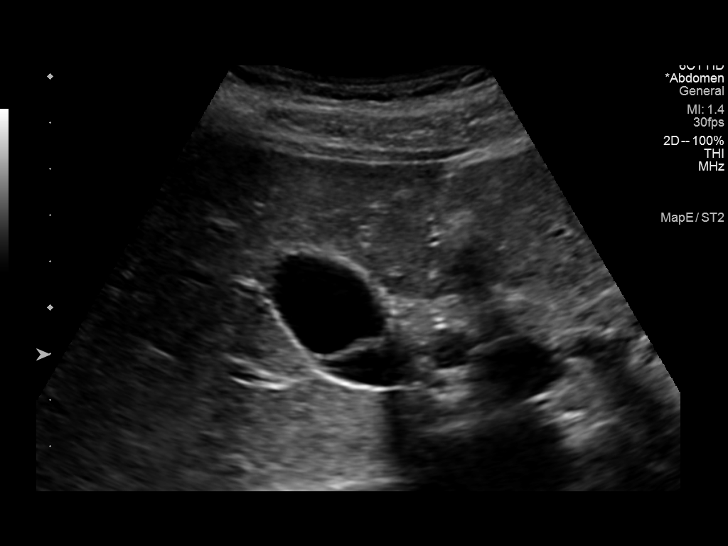
[im 20/58]
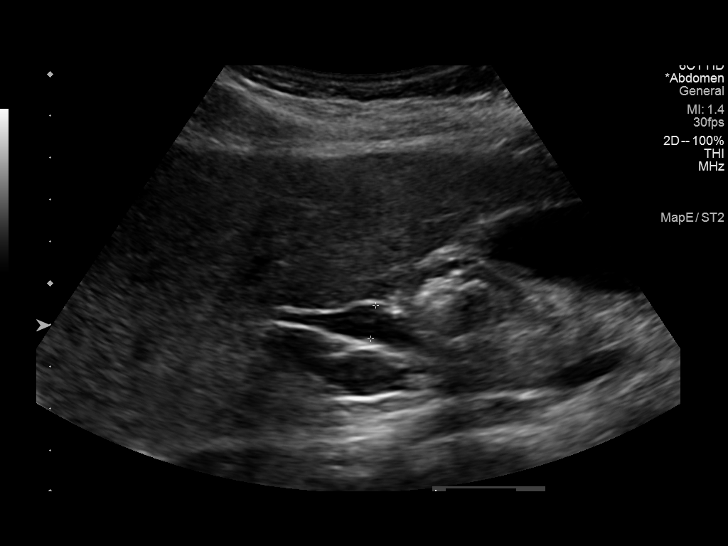
[im 22/58]
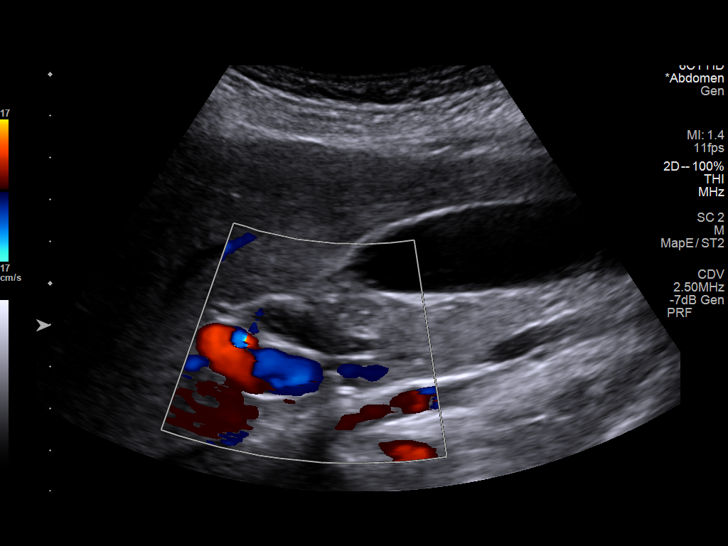
[im 27/58]
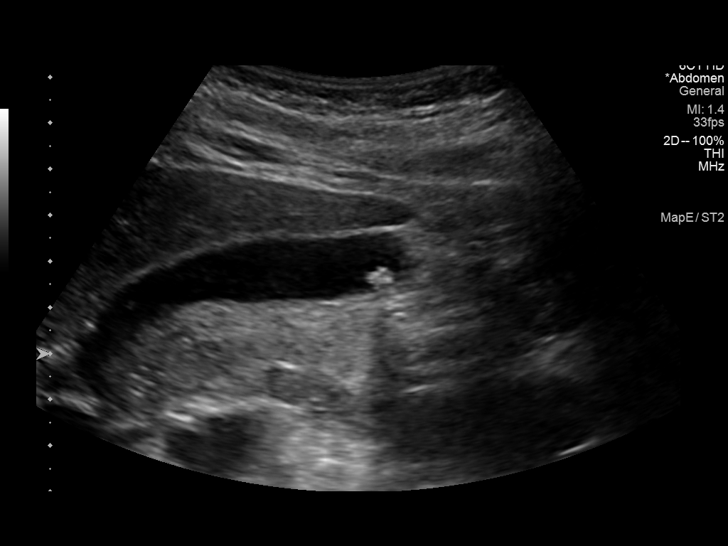
[im 31/58]
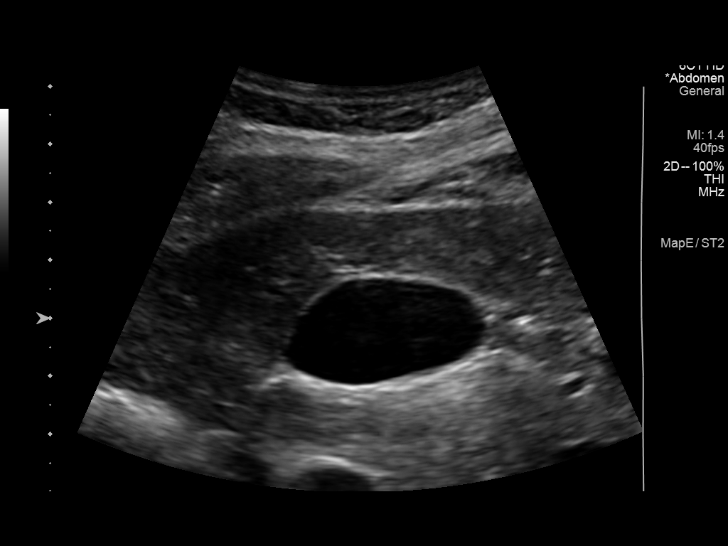
[im 36/58]
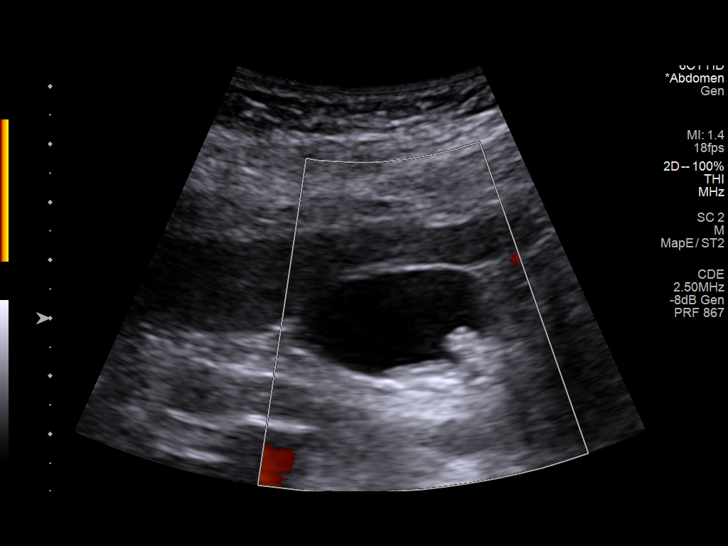
[im 39/58]
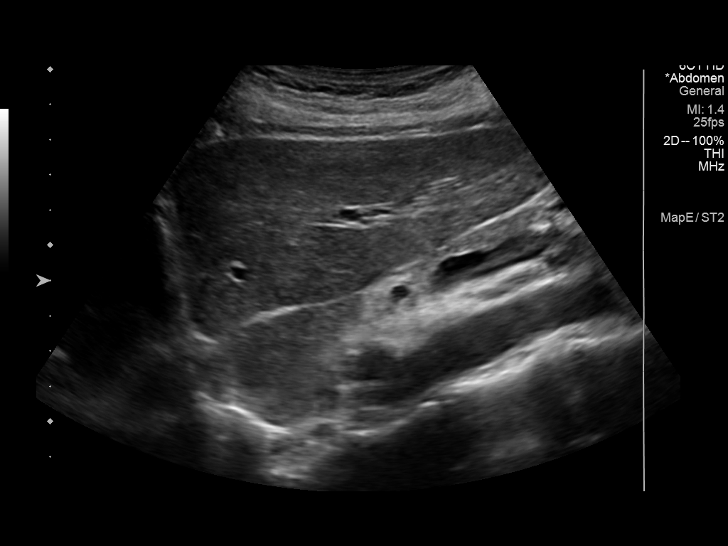
[im 43/58]
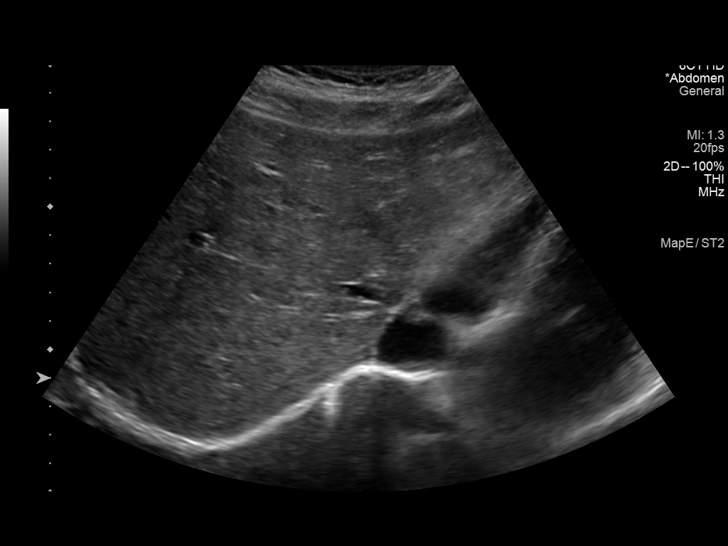
[im 48/58]
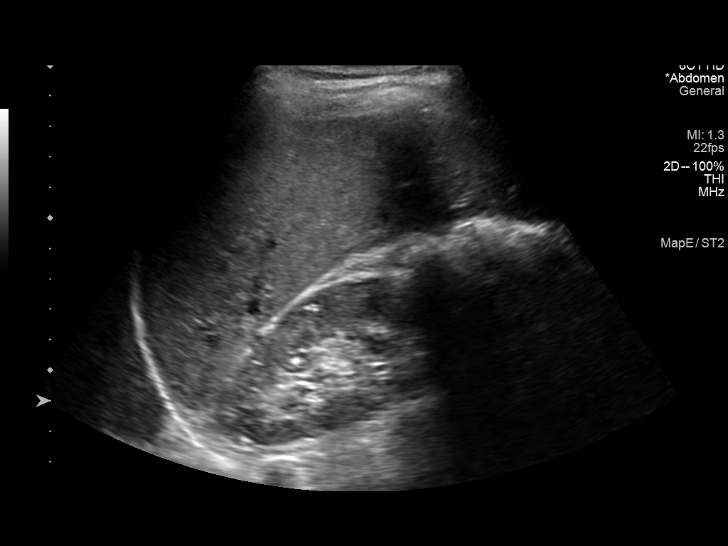
[im 53/58]
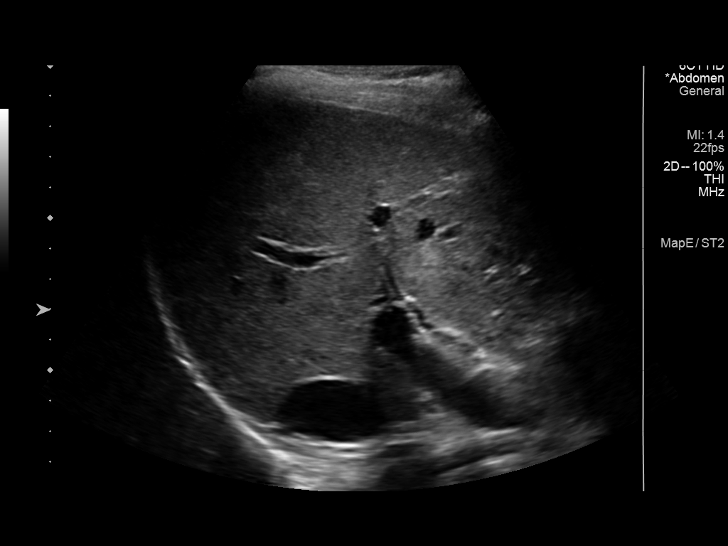
[im 58/58]
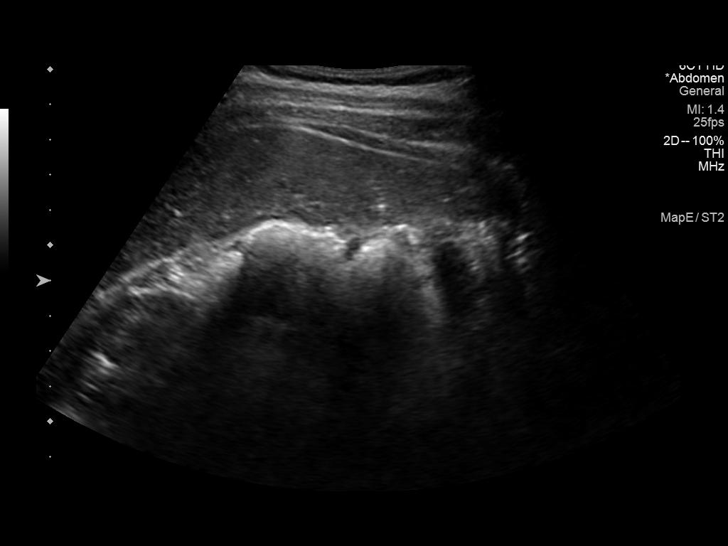

[14 of 25 positions shown; findings below may reference images not displayed]

FINDINGS: Gallbladder:

A few small gallstones are seen measuring up to 8 mm. No evidence of
gallbladder wall thickening or pericholecystic fluid. No sonographic
Murphy sign noted by sonographer.

Common bile duct:

Diameter: 4 mm, within normal limits.

Liver:

No focal lesion identified. Within normal limits in parenchymal
echogenicity. Portal vein is patent on color Doppler imaging with
normal direction of blood flow towards the liver.

Other: None.
IMPRESSION: Cholelithiasis. No sonographic signs of cholecystitis or biliary
ductal dilatation.

Normal sonographic appearance of liver.
